# Patient Record
Sex: Female | Born: 2000 | Race: White | Hispanic: No | Marital: Married | Smoking: Never smoker
Health system: Southern US, Community
[De-identification: ages and names within clinical notes are randomized; demographics above are authoritative.]

## PROBLEM LIST (undated history)

## (undated) DIAGNOSIS — K219 Gastro-esophageal reflux disease without esophagitis: Secondary | ICD-10-CM

## (undated) DIAGNOSIS — F32A Depression, unspecified: Secondary | ICD-10-CM

## (undated) HISTORY — PX: NO PAST SURGERIES: SHX2092

## (undated) HISTORY — DX: Gastro-esophageal reflux disease without esophagitis: K21.9

## (undated) HISTORY — DX: Depression, unspecified: F32.A

---
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
## 2019-03-03 ENCOUNTER — Other Ambulatory Visit (HOSPITAL_COMMUNITY): Payer: Self-pay | Admitting: Family Medicine

## 2019-03-03 LAB — WET PREP FOR TRICH, YEAST, CLUE
Trichomonas Exam: NEGATIVE
Yeast Exam: NEGATIVE

## 2019-03-03 LAB — HM HIV SCREENING LAB: HM HIV Screening: NEGATIVE

## 2019-03-03 LAB — PREGNANCY, URINE: Preg Test, Ur: NEGATIVE

## 2019-05-09 ENCOUNTER — Encounter: Payer: Self-pay | Admitting: Emergency Medicine

## 2019-05-09 ENCOUNTER — Other Ambulatory Visit: Payer: Self-pay

## 2019-05-09 ENCOUNTER — Inpatient Hospital Stay
Admission: AD | Admit: 2019-05-09 | Discharge: 2019-05-12 | DRG: 881 | Disposition: A | Discharge status: Home / Self Care | Payer: Medicaid Other | Source: Intra-hospital | Attending: Psychiatry | Admitting: Psychiatry

## 2019-05-09 ENCOUNTER — Emergency Department: Payer: Medicaid Other

## 2019-05-09 ENCOUNTER — Emergency Department
Admission: EM | Admit: 2019-05-09 | Discharge: 2019-05-09 | Disposition: A | Discharge status: Other Acute Inpatient Hospital | Payer: Medicaid Other | Attending: Emergency Medicine | Admitting: Emergency Medicine

## 2019-05-09 DIAGNOSIS — T50992A Poisoning by other drugs, medicaments and biological substances, intentional self-harm, initial encounter: Secondary | ICD-10-CM | POA: Diagnosis not present

## 2019-05-09 DIAGNOSIS — F329 Major depressive disorder, single episode, unspecified: Principal | ICD-10-CM | POA: Diagnosis present

## 2019-05-09 DIAGNOSIS — G8929 Other chronic pain: Secondary | ICD-10-CM | POA: Diagnosis not present

## 2019-05-09 DIAGNOSIS — Z20828 Contact with and (suspected) exposure to other viral communicable diseases: Secondary | ICD-10-CM | POA: Diagnosis not present

## 2019-05-09 DIAGNOSIS — Z915 Personal history of self-harm: Secondary | ICD-10-CM

## 2019-05-09 DIAGNOSIS — R109 Unspecified abdominal pain: Secondary | ICD-10-CM | POA: Diagnosis present

## 2019-05-09 DIAGNOSIS — R1084 Generalized abdominal pain: Secondary | ICD-10-CM

## 2019-05-09 DIAGNOSIS — Z9151 Personal history of suicidal behavior: Secondary | ICD-10-CM | POA: Diagnosis present

## 2019-05-09 DIAGNOSIS — T50902A Poisoning by unspecified drugs, medicaments and biological substances, intentional self-harm, initial encounter: Secondary | ICD-10-CM

## 2019-05-09 DIAGNOSIS — K219 Gastro-esophageal reflux disease without esophagitis: Secondary | ICD-10-CM | POA: Diagnosis present

## 2019-05-09 DIAGNOSIS — F322 Major depressive disorder, single episode, severe without psychotic features: Secondary | ICD-10-CM | POA: Diagnosis present

## 2019-05-09 DIAGNOSIS — Z79899 Other long term (current) drug therapy: Secondary | ICD-10-CM | POA: Diagnosis not present

## 2019-05-09 DIAGNOSIS — R41843 Psychomotor deficit: Secondary | ICD-10-CM | POA: Diagnosis present

## 2019-05-09 DIAGNOSIS — R112 Nausea with vomiting, unspecified: Secondary | ICD-10-CM | POA: Diagnosis not present

## 2019-05-09 DIAGNOSIS — F332 Major depressive disorder, recurrent severe without psychotic features: Secondary | ICD-10-CM

## 2019-05-09 HISTORY — DX: Poisoning by unspecified drugs, medicaments and biological substances, intentional self-harm, initial encounter: T50.902A

## 2019-05-09 LAB — COMPREHENSIVE METABOLIC PANEL
ALT: 13 U/L (ref 0–44)
AST: 16 U/L (ref 15–41)
Albumin: 4.3 g/dL (ref 3.5–5.0)
Alkaline Phosphatase: 38 U/L (ref 38–126)
Anion gap: 8 (ref 5–15)
BUN: 14 mg/dL (ref 6–20)
CO2: 26 mmol/L (ref 22–32)
Calcium: 9.3 mg/dL (ref 8.9–10.3)
Chloride: 104 mmol/L (ref 98–111)
Creatinine, Ser: 0.6 mg/dL (ref 0.44–1.00)
GFR calc Af Amer: >60 mL/min (ref >60)
GFR calc non Af Amer: >60 mL/min (ref >60)
Glucose, Bld: 99 mg/dL (ref 70–99)
Potassium: 3.8 mmol/L (ref 3.5–5.1)
Sodium: 138 mmol/L (ref 135–145)
Total Bilirubin: 0.9 mg/dL (ref 0.3–1.2)
Total Protein: 7.5 g/dL (ref 6.5–8.1)

## 2019-05-09 LAB — URINALYSIS, COMPLETE (UACMP) WITH MICROSCOPIC
Bacteria, UA: NONE SEEN
Bilirubin Urine: NEGATIVE
Glucose, UA: NEGATIVE mg/dL
Hgb urine dipstick: NEGATIVE
Ketones, ur: 5 mg/dL — AB
Leukocytes,Ua: NEGATIVE
Nitrite: NEGATIVE
Protein, ur: NEGATIVE mg/dL
Specific Gravity, Urine: 1.012 (ref 1.005–1.030)
pH: 6 (ref 5.0–8.0)

## 2019-05-09 LAB — CBC
HCT: 34.9 % — ABNORMAL LOW (ref 36.0–46.0)
Hemoglobin: 11.9 g/dL — ABNORMAL LOW (ref 12.0–15.0)
MCH: 30.1 pg (ref 26.0–34.0)
MCHC: 34.1 g/dL (ref 30.0–36.0)
MCV: 88.1 fL (ref 80.0–100.0)
Platelets: 183 10*3/uL (ref 150–400)
RBC: 3.96 MIL/uL (ref 3.87–5.11)
RDW: 12 % (ref 11.5–15.5)
WBC: 6.6 10*3/uL (ref 4.0–10.5)
nRBC: 0 % (ref 0.0–0.2)

## 2019-05-09 LAB — URINE DRUG SCREEN, QUALITATIVE (ARMC ONLY)
Amphetamines, Ur Screen: NOT DETECTED
Barbiturates, Ur Screen: NOT DETECTED
Benzodiazepine, Ur Scrn: NOT DETECTED
Cannabinoid 50 Ng, Ur ~~LOC~~: POSITIVE — AB
Cocaine Metabolite,Ur ~~LOC~~: NOT DETECTED
MDMA (Ecstasy)Ur Screen: NOT DETECTED
Methadone Scn, Ur: NOT DETECTED
Opiate, Ur Screen: NOT DETECTED
Phencyclidine (PCP) Ur S: NOT DETECTED
Tricyclic, Ur Screen: NOT DETECTED

## 2019-05-09 LAB — ACETAMINOPHEN LEVEL: Acetaminophen (Tylenol), Serum: <10 ug/mL — ABNORMAL LOW (ref 10–30)

## 2019-05-09 LAB — SARS CORONAVIRUS 2 BY RT PCR (HOSPITAL ORDER, PERFORMED IN ~~LOC~~ HOSPITAL LAB): SARS Coronavirus 2: NEGATIVE

## 2019-05-09 LAB — MAGNESIUM: Magnesium: 2.2 mg/dL (ref 1.7–2.4)

## 2019-05-09 LAB — ETHANOL: Alcohol, Ethyl (B): <10 mg/dL (ref <10)

## 2019-05-09 LAB — POCT PREGNANCY, URINE: Preg Test, Ur: NEGATIVE

## 2019-05-09 LAB — LIPASE, BLOOD: Lipase: 26 U/L (ref 11–51)

## 2019-05-09 LAB — SALICYLATE LEVEL: Salicylate Lvl: <7 mg/dL (ref 2.8–30.0)

## 2019-05-09 MED ORDER — SUCRALFATE 1 G PO TABS: 1.0000 g | ORAL_TABLET | Freq: Four times a day (QID) | ORAL | 1 refills | Status: DC | PRN

## 2019-05-09 MED ORDER — DIPHENHYDRAMINE HCL 25 MG PO CAPS
50.0000 mg | ORAL_CAPSULE | Freq: Three times a day (TID) | ORAL | Status: DC | PRN
  Administered 2019-05-11: 50 mg via ORAL
  Filled 2019-05-09: qty 2

## 2019-05-09 MED ORDER — ALUM & MAG HYDROXIDE-SIMETH 200-200-20 MG/5ML PO SUSP: 30.0000 mL | ORAL | Status: DC | PRN

## 2019-05-09 MED ORDER — NORGESTIM-ETH ESTRAD TRIPHASIC 0.18/0.215/0.25 MG-35 MCG PO TABS
1.0000 | ORAL_TABLET | Freq: Every day | ORAL | Status: DC
  Administered 2019-05-10 – 2019-05-12 (×3): 1 via ORAL
  Filled 2019-05-09 (×4): qty 1

## 2019-05-09 MED ORDER — ONDANSETRON 4 MG PO TBDP: ORAL_TABLET | ORAL | 0 refills | Status: DC

## 2019-05-09 MED ORDER — NORGESTIM-ETH ESTRAD TRIPHASIC 0.18/0.215/0.25 MG-35 MCG PO TABS
1.0000 | ORAL_TABLET | Freq: Every day | ORAL | Status: DC
  Administered 2019-05-09: 1 via ORAL

## 2019-05-09 MED ORDER — SUCRALFATE 1 G PO TABS: 1.0000 g | ORAL_TABLET | Freq: Four times a day (QID) | ORAL | Status: DC | PRN

## 2019-05-09 MED ORDER — OMEPRAZOLE MAGNESIUM 20 MG PO TBEC: 20.0000 mg | DELAYED_RELEASE_TABLET | Freq: Every day | ORAL | Status: DC

## 2019-05-09 MED ORDER — ACETAMINOPHEN 325 MG PO TABS: 650.0000 mg | ORAL_TABLET | Freq: Four times a day (QID) | ORAL | Status: DC | PRN

## 2019-05-09 MED ORDER — OMEPRAZOLE MAGNESIUM 20 MG PO TBEC: 20.0000 mg | DELAYED_RELEASE_TABLET | Freq: Every day | ORAL | 1 refills | Status: DC

## 2019-05-09 MED ORDER — MAGNESIUM HYDROXIDE 400 MG/5ML PO SUSP
30.0000 mL | Freq: Every day | ORAL | Status: DC | PRN
  Administered 2019-05-11: 30 mL via ORAL
  Filled 2019-05-09: qty 30

## 2019-05-09 MED ORDER — ALUM & MAG HYDROXIDE-SIMETH 200-200-20 MG/5ML PO SUSP
30.0000 mL | ORAL | Status: DC | PRN
  Administered 2019-05-09: 30 mL via ORAL
  Filled 2019-05-09: qty 30

## 2019-05-09 MED ORDER — ONDANSETRON HCL 4 MG PO TABS
8.0000 mg | ORAL_TABLET | Freq: Three times a day (TID) | ORAL | Status: DC | PRN
  Administered 2019-05-09: 8 mg via ORAL
  Filled 2019-05-09: qty 2

## 2019-05-09 MED ORDER — SUCRALFATE 1 G PO TABS
1.0000 g | ORAL_TABLET | Freq: Once | ORAL | Status: AC
  Administered 2019-05-09: 1 g via ORAL
  Filled 2019-05-09: qty 1

## 2019-05-09 MED ORDER — HYDROXYZINE HCL 10 MG PO TABS
10.0000 mg | ORAL_TABLET | Freq: Three times a day (TID) | ORAL | Status: DC | PRN
  Administered 2019-05-10 – 2019-05-11 (×3): 10 mg via ORAL
  Filled 2019-05-09 (×4): qty 1

## 2019-05-09 MED ORDER — DICYCLOMINE HCL 10 MG PO CAPS
10.0000 mg | ORAL_CAPSULE | Freq: Once | ORAL | Status: AC
  Administered 2019-05-09: 10 mg via ORAL
  Filled 2019-05-09: qty 1

## 2019-05-09 MED ORDER — PANTOPRAZOLE SODIUM 40 MG PO TBEC
40.0000 mg | DELAYED_RELEASE_TABLET | Freq: Every day | ORAL | Status: DC
  Administered 2019-05-09 – 2019-05-12 (×4): 40 mg via ORAL
  Filled 2019-05-09 (×4): qty 1

## 2019-05-09 MED ORDER — ONDANSETRON HCL 4 MG PO TABS: 8.0000 mg | ORAL_TABLET | Freq: Three times a day (TID) | ORAL | Status: DC | PRN

## 2019-05-09 NOTE — Progress Notes (Signed)
18 year old female admitted to unit. Alert and oriented x4. Currently denies SI/HI/AVH at present. Patient verbally contracts for safety. Patient reports that current stressors are the her current living arrangements with her partner and partner parents, her job and the turmoil in the relationship with her partner. Patient with sad affect, c/o abdominal pain rating it a 4. Oriented patient to room and unit. Skin and contraband search completed and witnessed by Waneta Martins, RN.  Tattoo to R) FA and ecchymosis to R) thigh , skin otherwise warm, dry and intact. No contraband found on patient nor her belongings. Home medications sent to pharmacy for storage and dispensing. Admission assessment completed, fluid and nutrition offered. Patient remains safe on the unit with q 15 minute checks.

## 2019-05-09 NOTE — ED Notes (Signed)
Report received from Jordan RN

## 2019-05-09 NOTE — Plan of Care (Signed)
New admission.  Problem: Education: Goal: Knowledge of [facility] Education information/materials will improve Outcome: Not Progressing Goal: Emotional status will improve Outcome: Not Progressing Goal: Mental status will improve Outcome: Not Progressing Goal: Verbalization of understanding the information provided will improve Outcome: Not Progressing   Problem: Activity: Goal: Interest or engagement in activities will improve Outcome: Not Progressing Goal: Sleeping patterns will improve Outcome: Not Progressing   Problem: Safety: Goal: Periods of time without injury will increase Outcome: Not Progressing   Problem: Coping: Goal: Coping ability will improve Outcome: Not Progressing Goal: Will verbalize feelings Outcome: Not Progressing   Problem: Health Behavior/Discharge Planning: Goal: Ability to make decisions will improve Outcome: Not Progressing Goal: Compliance with therapeutic regimen will improve Outcome: Not Progressing   Problem: Self-Concept: Goal: Will verbalize positive feelings about self Outcome: Not Progressing Goal: Level of anxiety will decrease Outcome: Not Progressing   

## 2019-05-09 NOTE — ED Provider Notes (Signed)
[facility] Emergency Department Provider Note  ____________________________________________   First MD Initiated Contact with Patient 05/09/19 0319     (approximate)  I have reviewed the triage vital signs and the nursing notes.   HISTORY  Chief Complaint Abdominal Pain and Suicidal    HPI Ashley Byrd is a 18 y.o. female who denies chronic medical issues and presents for evaluation of 2 main problems, abdominal pain with nausea and vomiting, and depression with some passive suicidal ideation.  Abdominal pain: The patient reports that she has been having "stomach problems" on and off for about a year.  She says it is gotten worse over the last 2 weeks.  She says that every time she eats she feels nauseated and frequently vomits and she has sharp and aching pain in the top part of her abdomen.  She went to an urgent care and they gave her medicine for acid reflux but it did not seem to help.  She vomits at least once every day.  Eating makes it worse and nothing particular makes it better.  Depression: She reports that she is suffered from depression for years but it seems to have gotten worse recently after moving out of her mom's house and into the house of parents of a friend of hers.  Her depression is become severe and it was bad enough about 2 weeks ago that she took all of her medicines and attempt to overdose, but she had no bad side effects.  She states that she still thinks about killing herself but she has no plans to do so and she does not want to die, she wants to get help.  She takes no antidepressants.  She has never been hospitalized for psychiatric purposes.  She is willing to contract for safety and knows that she needs help.    She says that her symptoms are severe and gradual in onset (both of them).  She denies fever/chills, shortness of breath, cough, lower abdominal pain, dysuria.  Her last menstrual cycle was 2 weeks ago.     History reviewed. No pertinent past medical history.  There are no active problems to display for this patient.   History reviewed. No pertinent surgical history.  Prior to Admission medications   Medication Sig Start Date End Date Taking? Authorizing Provider  omeprazole (PRILOSEC OTC) 20 MG tablet Take 1 tablet (20 mg total) by mouth daily. 05/09/19 05/08/20  Loleta RoseForbach, Schylar Wuebker, MD  ondansetron (ZOFRAN ODT) 4 MG disintegrating tablet Allow 1-2 tablets to dissolve in your mouth every 8 hours as needed for nausea/vomiting 05/09/19   Loleta RoseForbach, Margaretann Abate, MD  sucralfate (CARAFATE) 1 g tablet Take 1 tablet (1 g total) by mouth 4 (four) times daily as needed (for abdominal discomfort, nausea, and/or vomiting). 05/09/19   Loleta RoseForbach, Daisey Caloca, MD    Allergies Patient has no known allergies.  No family history on file.  Social History Social History   Tobacco Use  . Smoking status: Never Smoker  . Smokeless tobacco: Never Used  Substance Use Topics  . Alcohol use: Not Currently  . Drug use: Never    Review of Systems Constitutional: No fever/chills Eyes: No visual changes. ENT: No sore throat. Cardiovascular: Denies chest pain. Respiratory: Denies shortness of breath. Gastrointestinal: Upper abdominal pain with nausea and vomiting, occurs whenever she eats, has been going on about a year. Genitourinary: Negative for dysuria. Musculoskeletal: Negative for neck pain.  Negative for back pain. Integumentary: Negative for rash. Neurological: Negative for  headaches, focal weakness or numbness. Psych: Depression with alleged suicide attempt (overdose) about 2 weeks ago, now just depressed and knows she needs help  ____________________________________________   PHYSICAL EXAM:  VITAL SIGNS: ED Triage Vitals  Enc Vitals Group     BP 05/09/19 0259 128/86     Pulse Rate 05/09/19 0259 82     Resp 05/09/19 0259 18     Temp 05/09/19 0259 97.9 F (36.6 C)     Temp Source 05/09/19 0259 Oral     SpO2  05/09/19 0259 97 %     Weight 05/09/19 0255 48.5 kg (107 lb)     Height 05/09/19 0255 1.524 m (5')     Head Circumference --      Peak Flow --      Pain Score 05/09/19 0255 8     Pain Loc --      Pain Edu? --      Excl. in [place]? --    Constitutional: Alert and oriented. Well appearing and in no acute distress. Eyes: Conjunctivae are normal.  Head: Atraumatic. Nose: No congestion/rhinnorhea. Mouth/Throat: Mucous membranes are moist. Neck: No stridor.  No meningeal signs.   Cardiovascular: Normal rate, regular rhythm. Good peripheral circulation. Grossly normal heart sounds. Respiratory: Normal respiratory effort.  No retractions. No audible wheezing. Gastrointestinal: Soft and nondistended.  Mild tenderness to palpation of the epigastrium and supraumbilical region.  Equivocal Murphy sign.  No lower abdominal tenderness to palpation, no rebound, no guarding, no evidence of peritonitis. Musculoskeletal: No lower extremity tenderness nor edema. No gross deformities of extremities. Neurologic:  Normal speech and language. No gross focal neurologic deficits are appreciated.  Skin:  Skin is warm, dry and intact. No rash noted. Psychiatric: Mood and affect are normal. Speech and behavior are normal.  She has a capacity to make her own decisions.  She admits to depression but contracts for safety, some persistent passive SI but no plan and she states that she does not want to die.  ____________________________________________   LABS (all labs ordered are listed, but only abnormal results are displayed)  Labs Reviewed  ACETAMINOPHEN LEVEL - Abnormal; Notable for the following components:      Result Value   Acetaminophen (Tylenol), Serum <10 (*)    All other components within normal limits  CBC - Abnormal; Notable for the following components:   Hemoglobin 11.9 (*)    HCT 34.9 (*)    All other components within normal limits  URINALYSIS, COMPLETE (UACMP) WITH MICROSCOPIC - Abnormal; Notable  for the following components:   Color, Urine YELLOW (*)    APPearance HAZY (*)    Ketones, ur 5 (*)    All other components within normal limits  URINE DRUG SCREEN, QUALITATIVE ([facility] ONLY) - Abnormal; Notable for the following components:   Cannabinoid 50 Ng, Ur [place] POSITIVE (*)    All other components within normal limits  SARS CORONAVIRUS 2 (HOSPITAL ORDER, [place] [facility])  COMPREHENSIVE METABOLIC PANEL  ETHANOL  SALICYLATE LEVEL  LIPASE, BLOOD  MAGNESIUM  POC URINE PREG, ED  POCT PREGNANCY, URINE   ____________________________________________  EKG  No indication for EKG ____________________________________________  RADIOLOGY   ED MD interpretation: No acute abnormalities identified on right upper quadrant ultrasound  Official radiology report(s): US Abdomen Limited Ruq  Result Date: 05/09/2019 CLINICAL DATA:  Abdominal pain and vomiting. EXAM: ULTRASOUND ABDOMEN LIMITED RIGHT UPPER QUADRANT COMPARISON:  None. FINDINGS: Gallbladder: No gallstones or wall thickening visualized. No sonographic Percell Miller  sign noted by sonographer. Maximal wall thickness is 2.2 mm, within normal limits Common bile duct: Diameter: 3.8 mm, within normal limits Liver: No focal lesion identified. Within normal limits in parenchymal echogenicity. Portal vein is patent on color Doppler imaging with normal direction of blood flow towards the liver. IMPRESSION: Normal right upper quadrant ultrasound. No acute or focal lesion to explain the patient's abdominal pain or vomiting. Electronically Signed   By: Marin Robertshristopher  Mattern M.D.   On: 05/09/2019 05:07    ____________________________________________   PROCEDURES   Procedure(s) performed (including Critical Care):  Procedures   ____________________________________________   INITIAL IMPRESSION / MDM / ASSESSMENT AND PLAN / ED COURSE  As part of my medical decision making, I reviewed the following data within the electronic  MEDICAL RECORD NUMBER Nursing notes reviewed and incorporated, Labs reviewed , Old chart reviewed, A consult was requested and obtained from this/these consultant(s) Psychiatry, Notes from prior ED visits and [place] Controlled Substance Database      *Ashley Byrd was evaluated in Emergency Department on 05/09/2019 for the symptoms described in the history of present illness. She was evaluated in the context of the global COVID-19 pandemic, which necessitated consideration that the patient might be at risk for infection with the SARS-CoV-2 virus that causes COVID-19. Institutional protocols and algorithms that pertain to the evaluation of patients at risk for COVID-19 are in a state of rapid change based on information released by regulatory bodies including the CDC and federal and state organizations. These policies and algorithms were followed during the patient's care in the ED.  Some ED evaluations and interventions may be delayed as a result of limited staffing during the pandemic.*  Differential diagnosis for the abdominal pain includes, but is not limited to, biliary colic, acid reflux, persistent nonspecific gastritis, IBD, IBS.  Differential diagnosis for depression includes true suicidal ideation, major depressive disorder, mood disorder, adjustment disorder.  She is not in any acute distress and her vital signs are stable.  Lab work is all reassuring with normal comprehensive metabolic panel, lipase, ethanol, magnesium, salicylate, acetaminophen, and urine tests.  Based on the length of time she has been having the abdominal pain, nausea, and vomiting, I suspect that she suffers from biliary colic but also her psychiatric health may also be contributing to her abdominal discomfort (IBS instead of IBD).  She has no peritoneal signs, lab work is all reassuring, and I do not think she would benefit from a CT scan.  However I will check an ultrasound of her right upper quadrant to look for evidence of  gallstones.  After the ultrasound I will try some Bentyl as well as some Carafate.  I anticipate she will benefit from GI follow-up.  Regarding the psychiatric disposition, the patient wants help and contracts for safety.  I do not believe she represents an immediate risk to herself or anyone else.  I will not put her under involuntary commitment but have asked psychiatry to see her.  She agrees with the plan.  Clinical Course as of May 08 622  Mon May 09, 2019  0319 Preg Test, Ur: NEGATIVE [CF]  0510 Reassuring ultrasound with no evidence of gallstones.  I suspect the patient is suffering from some gastritis and would benefit from gastroenterology evaluation but does not require further imaging at this time.  I have ordered Bentyl 10 mg by mouth and Carafate 1 g by mouth.  She is awaiting psychiatric evaluation and disposition.  US ABDOMEN LIMITED RUQ [CF]  [phone] Labs are all within normal limits including urinalysis.  She does have a few ketones in her urine but not a significant amount.   [CF]  0620 SARS Coronavirus 2: NEGATIVE [CF]    Clinical Course User Index [CF] Loleta RoseForbach, Mariaguadalupe Fialkowski, MD     ____________________________________________  FINAL CLINICAL IMPRESSION(S) / ED DIAGNOSES  Final diagnoses:  Severe episode of recurrent major depressive disorder, without psychotic features (HCC)  Chronic abdominal pain     MEDICATIONS GIVEN DURING THIS VISIT:  Medications  dicyclomine (BENTYL) capsule 10 mg (10 mg Oral Given 05/09/19 0459)  sucralfate (CARAFATE) tablet 1 g (1 g Oral Given 05/09/19 0459)     ED Discharge Orders         Ordered    sucralfate (CARAFATE) 1 g tablet  4 times daily PRN     05/09/19 0623    omeprazole (PRILOSEC OTC) 20 MG tablet  Daily     05/09/19 0623    ondansetron (ZOFRAN ODT) 4 MG disintegrating tablet     05/09/19 [id number]           Note:  This document was prepared using Dragon voice recognition software and may include unintentional dictation errors.    Loleta RoseForbach, Shermaine Brigham, MD 05/09/19 [phone]
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
## 2019-05-09 NOTE — ED Notes (Signed)
TTS at the bedside. 

## 2019-05-09 NOTE — ED Notes (Signed)
ED Provider at bedside. 

## 2019-05-09 NOTE — ED Triage Notes (Signed)
Pt arrives POV to triage with c/o stomach problems x 1 year that has become worse in the last two weeks. Pt states that every time that she eats she has been vomiting. Pt states that she has had x 2 episodes of eating and then vomiting today. Pt appears well nourished and hydrated at this time and is in NAD.

## 2019-05-09 NOTE — ED Notes (Signed)
Patient states that she has had suicidal thoughts for years. Patient states that over the last few weeks that the thoughts have been increasing. Patient states that about 2 weeks ago she overdosed on medication but not receive any medical attention at that time. Patient is contracting for safety at this time.

## 2019-05-09 NOTE — BH Assessment (Signed)
Patient is to be admitted to [facility] by Dr. Leverne Humbles.  Attending Physician will be Dr. Weber Cooks.   Patient has been assigned to room 323, by [facility] Charge Nurse T'Yawn.   Intake Paper Work has been signed and placed on patient chart.  ER staff is aware of the admission:  Lattie Haw, ER Secretary    Dr. Cherylann Banas, ER MD   Quillian Quince, Patient's Nurse   Gust Rung., Patient Access

## 2019-05-09 NOTE — Tx Team (Signed)
Initial Treatment Plan 05/09/2019 5:22 PM Ashley Byrd RCB:[id number]    PATIENT STRESSORS: Health problems Marital or family conflict Occupational concerns   PATIENT STRENGTHS: Ability for insight General fund of knowledge Motivation for treatment/growth   PATIENT IDENTIFIED PROBLEMS: Depression  Health concerns                   DISCHARGE CRITERIA:  Ability to meet basic life and health needs Improved stabilization in mood, thinking, and/or behavior Motivation to continue treatment in a less acute level of care Reduction of life-threatening or endangering symptoms to within safe limits  PRELIMINARY DISCHARGE PLAN: Outpatient therapy Return to previous living arrangement Return to previous work or school arrangements  PATIENT/FAMILY INVOLVEMENT: This treatment plan has been presented to and reviewed with the patient, Ashley Byrd. The patient has been given the opportunity to ask questions and make suggestions.  Manmeet Arzola, RN 05/09/2019, 5:22 PM
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
## 2019-05-10 DIAGNOSIS — F322 Major depressive disorder, single episode, severe without psychotic features: Secondary | ICD-10-CM

## 2019-05-10 MED ORDER — FLUOXETINE HCL 10 MG PO CAPS
10.0000 mg | ORAL_CAPSULE | Freq: Every day | ORAL | Status: DC
  Administered 2019-05-10 – 2019-05-12 (×3): 10 mg via ORAL
  Filled 2019-05-10 (×3): qty 1

## 2019-05-10 MED ORDER — ONDANSETRON HCL 4 MG PO TABS: 4.0000 mg | ORAL_TABLET | Freq: Three times a day (TID) | ORAL | Status: DC | PRN

## 2019-05-10 MED ORDER — ENSURE ENLIVE PO LIQD
237.0000 mL | Freq: Two times a day (BID) | ORAL | Status: DC
  Administered 2019-05-11 (×2): 237 mL via ORAL

## 2019-05-10 NOTE — H&P (Signed)
Psychiatric Admission Assessment Adult  Patient Identification: Ashley Byrd MRN:  [id number] Date of Evaluation:  05/10/2019 Chief Complaint:  Depression Principal Diagnosis: MDD (major depressive disorder), single episode, severe (HCC) Diagnosis:  Principal Problem:   MDD (major depressive disorder), single episode, severe (HCC) Active Problems:   Suicide attempt by drug overdose (HCC)   Abdominal pain  History of Present Illness: Patient seen and chart reviewed.  This is an 18 year old woman who presented to the emergency room initially to complain about her stomach pain but then admitted she was depressed and having suicidal ideation.  Patient says that she has been feeling depressed on and off for a couple years but things of rapidly escalated over the last couple months.  Moods feels down a lot.  Feels negative and hopeless.  Feels like she will never get anywhere in life.  She says that the people she lives with are stressing her out and making her feel even worse about herself although when she describes it it sounds like it is more that they are just encouraging her and not being abusive.  Patient admits to recent suicidal ideation and says a couple weeks ago she took pills for an overdose but did not get any treatment for it.  She has not been getting any outpatient psychiatric treatment.  Patient reports that there has been times in the not too distant past when she briefly heard hallucinations but she is not hearing any kind of hallucinations or having any psychotic symptoms now.  Has used marijuana once or twice in the last month but claims to not use it regularly and not to drink regularly.  Patient is complaining of chronic severe stomach pain.  The way she describes it sounds typical for reflux indicating that it is in her area around her mid epigastric region with radiation straight up along her esophagus.  Also has recently been throwing up and not eating well.  [facility] but no other medical treatment for it so far. Associated Signs/Symptoms: Depression Symptoms:  depressed mood, anhedonia, psychomotor retardation, feelings of worthlessness/guilt, difficulty concentrating, suicidal thoughts without plan, (Hypo) Manic Symptoms:  None reported Anxiety Symptoms:  Excessive Worry, Psychotic Symptoms:  Hallucinations: Auditory PTSD Symptoms: Had a traumatic exposure:  Patient describes a situation in which she had inappropriate photographs of herself distributor around her high school a couple years ago.  This was so humiliating it caused her to have to leave her high school and leave her mother's home and relocate to another school district. Total Time spent with patient: 1 hour  Past Psychiatric History: Reports that a couple weeks ago she did overdose on pills.  Did not apparently do her self any harm.  She denies any history of bulimia even though she is throwing up so she never makes herself throw up on purpose.  Never been on any antidepressants or had any psychiatric treatment in the past no previous hospitalizations  Is the patient at risk to self? Yes.    Has the patient been a risk to self in the past 6 months? Yes.    Has the patient been a risk to self within the distant past? No.  Is the patient a risk to others? No.  Has the patient been a risk to others in the past 6 months? No.  Has the patient been a risk to others within the distant past? No.   Prior Inpatient Therapy:   Prior Outpatient Therapy:  Alcohol Screening: 1. How often do you have a drink containing alcohol?: Never 2. How many drinks containing alcohol do you have on a typical day when you are drinking?: 1 or 2 3. How often do you have six or more drinks on one occasion?: Never AUDIT-C Score: 0 4. How often during the last year have you found that you were not able to stop drinking once you had started?: Never 5. How often during the last year have you  failed to do what was normally expected from you becasue of drinking?: Never 6. How often during the last year have you needed a first drink in the morning to get yourself going after a heavy drinking session?: Never 7. How often during the last year have you had a feeling of guilt of remorse after drinking?: Never 8. How often during the last year have you been unable to remember what happened the night before because you had been drinking?: Never 9. Have you or someone else been injured as a result of your drinking?: No 10. Has a relative or friend or a doctor or another health worker been concerned about your drinking or suggested you cut down?: No Alcohol Use Disorder Identification Test Final Score (AUDIT): 0 Alcohol Brief Interventions/Follow-up: AUDIT Score <7 follow-up not indicated Substance Abuse History in the last 12 months:  No. Consequences of Substance Abuse: Negative Previous Psychotropic Medications: No  Psychological Evaluations: No  Past Medical History: History reviewed. No pertinent past medical history. History reviewed. No pertinent surgical history. Family History: History reviewed. No pertinent family history. Family Psychiatric  History: She says that her mother has told her that hearing voices runs in the family but she does not know what that means and does not know of any specific history of mental health problems in the family.  Knows of no suicide in the family. Tobacco Screening: Have you used any form of tobacco in the last 30 days? (Cigarettes, Smokeless Tobacco, Cigars, and/or Pipes): No Social History:  Social History   Substance and Sexual Activity  Alcohol Use Not Currently     Social History   Substance and Sexual Activity  Drug Use Never    Additional Social History: Marital status: Single Are you sexually active?: Yes What is your sexual orientation?: Heterosexual Has your sexual activity been affected by drugs, alcohol, medication, or  emotional stress?: Pt denies. Does patient have children?: No    History of alcohol / drug use?: No history of alcohol / drug abuse                    Allergies:  No Known Allergies Lab Results:  Results for orders placed or performed during the hospital encounter of 05/09/19 (from the past 48 hour(s))  Comprehensive metabolic panel     Status: None   Collection Time: 05/09/19  3:04 AM  Result Value Ref Range   Sodium 138 135 - 145 mmol/L   Potassium 3.8 3.5 - 5.1 mmol/L   Chloride 104 98 - 111 mmol/L   CO2 26 22 - 32 mmol/L   Glucose, Bld 99 70 - 99 mg/dL   BUN 14 6 - 20 mg/dL   Creatinine, Ser 1.61 0.44 - 1.00 mg/dL   Calcium 9.3 8.9 - 09.6 mg/dL   Total Protein 7.5 6.5 - 8.1 g/dL   Albumin 4.3 3.5 - 5.0 g/dL   AST 16 15 - 41 U/L   ALT 13 0 - 44 U/L   Alkaline Phosphatase  38 38 - 126 U/L   Total Bilirubin 0.9 0.3 - 1.2 mg/dL   GFR calc non Af Amer >60 >60 mL/min   GFR calc Af Amer >60 >60 mL/min   Anion gap 8 5 - 15    Comment: Performed at [facility], [address] [place], [place] [address]  Ethanol     Status: None   Collection Time: 05/09/19  3:04 AM  Result Value Ref Range   Alcohol, Ethyl (B) <10 <10 mg/dL    Comment: (NOTE) Lowest detectable limit for serum alcohol is 10 mg/dL. For medical purposes only. Performed at [facility], [address] [place], [place] [address]   Salicylate level     Status: None   Collection Time: 05/09/19  3:04 AM  Result Value Ref Range   Salicylate Lvl <7.0 2.8 - 30.0 mg/dL    Comment: Performed at [facility], [address] [place], [place] [address]  Acetaminophen level     Status: Abnormal   Collection Time: 05/09/19  3:04 AM  Result Value Ref Range   Acetaminophen (Tylenol), Serum <10 (L) 10 - 30 ug/mL    Comment: (NOTE) Therapeutic concentrations vary significantly. A range of 10-30 ug/mL  may be an effective concentration for many patients. However, some  are best treated  at concentrations outside of this range. Acetaminophen concentrations >150 ug/mL at 4 hours after ingestion  and >50 ug/mL at 12 hours after ingestion are often associated with  toxic reactions. Performed at [facility], [address] [place], [place] [address]   cbc     Status: Abnormal   Collection Time: 05/09/19  3:04 AM  Result Value Ref Range   WBC 6.6 4.0 - 10.5 K/uL   RBC 3.96 3.87 - 5.11 MIL/uL   Hemoglobin 11.9 (L) 12.0 - 15.0 g/dL   HCT 29.534.9 (L) 62.136.0 - 30.846.0 %   MCV 88.1 80.0 - 100.0 fL   MCH 30.1 26.0 - 34.0 pg   MCHC 34.1 30.0 - 36.0 g/dL   RDW 65.712.0 84.611.5 - 96.215.5 %   Platelets 183 150 - 400 K/uL   nRBC 0.0 0.0 - 0.2 %    Comment: Performed at [facility], [address] [place], [place] [address]  Lipase, blood     Status: None   Collection Time: 05/09/19  3:04 AM  Result Value Ref Range   Lipase 26 11 - 51 U/L    Comment: Performed at [facility], [address] [place], [place] [address]  Urinalysis, Complete w Microscopic     Status: Abnormal   Collection Time: 05/09/19  3:04 AM  Result Value Ref Range   Color, Urine YELLOW (A) YELLOW   APPearance HAZY (A) CLEAR   Specific Gravity, Urine 1.012 1.005 - 1.030   pH 6.0 5.0 - 8.0   Glucose, UA NEGATIVE NEGATIVE mg/dL   Hgb urine dipstick NEGATIVE NEGATIVE   Bilirubin Urine NEGATIVE NEGATIVE   Ketones, ur 5 (A) NEGATIVE mg/dL   Protein, ur NEGATIVE NEGATIVE mg/dL   Nitrite NEGATIVE NEGATIVE   Leukocytes,Ua NEGATIVE NEGATIVE   WBC, UA 0-5 0 - 5 WBC/hpf   Bacteria, UA NONE SEEN NONE SEEN   Squamous Epithelial / LPF 0-5 0 - 5   Mucus PRESENT     Comment: Performed at [facility], [address] [place], [place] [address]  Magnesium     Status: None   Collection Time: 05/09/19  3:04 AM  Result  Value Ref Range   Magnesium 2.2 1.7 - 2.4 mg/dL    Comment: Performed at [facility], [address] [place], [place] [address]  Urine Drug Screen, Qualitative      Status: Abnormal   Collection Time: 05/09/19  3:04 AM  Result Value Ref Range   Tricyclic, Ur Screen NONE DETECTED NONE DETECTED   Amphetamines, Ur Screen NONE DETECTED NONE DETECTED   MDMA (Ecstasy)Ur Screen NONE DETECTED NONE DETECTED   Cocaine Metabolite,Ur [place] NONE DETECTED NONE DETECTED   Opiate, Ur Screen NONE DETECTED NONE DETECTED   Phencyclidine (PCP) Ur S NONE DETECTED NONE DETECTED   Cannabinoid 50 Ng, Ur [place] POSITIVE (A) NONE DETECTED   Barbiturates, Ur Screen NONE DETECTED NONE DETECTED   Benzodiazepine, Ur Scrn NONE DETECTED NONE DETECTED   Methadone Scn, Ur NONE DETECTED NONE DETECTED    Comment: (NOTE) Tricyclics + metabolites, urine    Cutoff 1000 ng/mL Amphetamines + metabolites, urine  Cutoff 1000 ng/mL MDMA (Ecstasy), urine              Cutoff 500 ng/mL Cocaine Metabolite, urine          Cutoff 300 ng/mL Opiate + metabolites, urine        Cutoff 300 ng/mL Phencyclidine (PCP), urine         Cutoff 25 ng/mL Cannabinoid, urine                 Cutoff 50 ng/mL Barbiturates + metabolites, urine  Cutoff 200 ng/mL Benzodiazepine, urine              Cutoff 200 ng/mL Methadone, urine                   Cutoff 300 ng/mL The urine drug screen provides only a preliminary, unconfirmed analytical test result and should not be used for non-medical purposes. Clinical consideration and professional judgment should be applied to any positive drug screen result due to possible interfering substances. A more specific alternate chemical method must be used in order to obtain a confirmed analytical result. Gas chromatography / mass spectrometry (GC/MS) is the preferred confirmat ory method. Performed at [facility], [address] [place], [place] [address]   Pregnancy, urine POC     Status: None   Collection Time: 05/09/19  3:07 AM  Result Value Ref Range   Preg Test, Ur NEGATIVE NEGATIVE    Comment:        THE SENSITIVITY OF THIS METHODOLOGY IS >24 mIU/mL   SARS  Coronavirus 2 (CEPHEID - Performed in [facility] hospital lab), Hosp Order     Status: None   Collection Time: 05/09/19  5:00 AM  Result Value Ref Range   SARS Coronavirus 2 NEGATIVE NEGATIVE    Comment: (NOTE) If result is NEGATIVE SARS-CoV-2 target nucleic acids are NOT DETECTED. The SARS-CoV-2 RNA is generally detectable in upper and lower  respiratory specimens during the acute phase of infection. The lowest  concentration of SARS-CoV-2 viral copies this assay can detect is 250  copies / mL. A negative result does not preclude SARS-CoV-2 infection  and should not be used as the sole basis for treatment or other  patient management decisions.  A negative result may occur with  improper specimen collection / handling, submission of specimen other  than nasopharyngeal swab, presence of viral mutation(s) within the  areas targeted by this assay, and inadequate number of viral copies  (<250 copies / mL). A negative result must be  combined with clinical  observations, patient history, and epidemiological information. If result is POSITIVE SARS-CoV-2 target nucleic acids are DETECTED. The SARS-CoV-2 RNA is generally detectable in upper and lower  respiratory specimens dur ing the acute phase of infection.  Positive  results are indicative of active infection with SARS-CoV-2.  Clinical  correlation with patient history and other diagnostic information is  necessary to determine patient infection status.  Positive results do  not rule out bacterial infection or co-infection with other viruses. If result is PRESUMPTIVE POSTIVE SARS-CoV-2 nucleic acids MAY BE PRESENT.   A presumptive positive result was obtained on the submitted specimen  and confirmed on repeat testing.  While 2019 novel coronavirus  (SARS-CoV-2) nucleic acids may be present in the submitted sample  additional confirmatory testing may be necessary for epidemiological  and / or clinical management purposes  to  differentiate between  SARS-CoV-2 and other Sarbecovirus currently known to infect humans.  If clinically indicated additional testing with an alternate test  methodology [phone]) is advised. The SARS-CoV-2 RNA is generally  detectable in upper and lower respiratory sp ecimens during the acute  phase of infection. The expected result is Negative. Fact Sheet for Patients:  BoilerBrush.com.cy Fact Sheet for Healthcare Providers: https://pope.com/ This test is not yet approved or cleared by the [place] FDA and has been authorized for detection and/or diagnosis of SARS-CoV-2 by FDA under an Emergency Use Authorization (EUA).  This EUA will remain in effect (meaning this test can be used) for the duration of the COVID-19 declaration under Section 564(b)(1) of the Act, 21 U.S.C. section 360bbb-3(b)(1), unless the authorization is terminated or revoked sooner. Performed at [facility], [address] [place], [place] [address]     Blood Alcohol level:  Lab Results  Component Value Date   [facility] <10 05/09/2019    Metabolic Disorder Labs:  No results found for: HGBA1C, MPG No results found for: PROLACTIN No results found for: CHOL, TRIG, HDL, CHOLHDL, VLDL, LDLCALC  Current Medications: Current Facility-Administered Medications  Medication Dose Route Frequency Provider Last Rate Last Dose  . acetaminophen (TYLENOL) tablet 650 mg  650 mg Oral Q6H PRN Mariel Craft, MD      . alum & mag hydroxide-simeth (MAALOX/MYLANTA) 200-200-20 MG/5ML suspension 30 mL  30 mL Oral Q4H PRN Mariel Craft, MD      . diphenhydrAMINE (BENADRYL) capsule 50 mg  50 mg Oral Q8H PRN Mariel Craft, MD      . feeding supplement (ENSURE ENLIVE) (ENSURE ENLIVE) liquid 237 mL  237 mL Oral BID BM Clapacs, John T, MD      . FLUoxetine (PROZAC) capsule 10 mg  10 mg Oral Daily Clapacs, Jackquline Denmark, MD   10 mg at 05/10/19 1249  . hydrOXYzine  (ATARAX/VISTARIL) tablet 10 mg  10 mg Oral TID PRN Mariel Craft, MD   10 mg at 05/10/19 0857  . magnesium hydroxide (MILK OF MAGNESIA) suspension 30 mL  30 mL Oral Daily PRN Mariel Craft, MD      . Norgestimate-Ethinyl Estradiol Triphasic 0.18/0.215/0.25 MG-35 MCG tablet 1 tablet  1 tablet Oral Daily Clapacs, Jackquline Denmark, MD   1 tablet at 05/10/19 1359  . ondansetron (ZOFRAN) tablet 4 mg  4 mg Oral Q8H PRN Clapacs, John T, MD      . pantoprazole (PROTONIX) EC tablet 40 mg  40 mg Oral Daily Clapacs, Jackquline Denmark, MD   40 mg at 05/10/19 0857   PTA Medications: Medications  Prior to Admission  Medication Sig Dispense Refill Last Dose  . omeprazole (PRILOSEC OTC) 20 MG tablet Take 1 tablet (20 mg total) by mouth daily. 28 tablet 1   . ondansetron (ZOFRAN ODT) 4 MG disintegrating tablet Allow 1-2 tablets to dissolve in your mouth every 8 hours as needed for nausea/vomiting 30 tablet 0   . sucralfate (CARAFATE) 1 g tablet Take 1 tablet (1 g total) by mouth 4 (four) times daily as needed (for abdominal discomfort, nausea, and/or vomiting). 30 tablet 1   . TRI-PREVIFEM 0.18/0.215/0.25 MG-35 MCG tablet Take 1 tablet by mouth daily. as directed   05/06/2019 at 2000    Musculoskeletal: Strength & Muscle Tone: within normal limits Gait & Station: normal Patient leans: N/A  Psychiatric Specialty Exam: Physical Exam  Nursing note and vitals reviewed. Constitutional: She appears well-developed and well-nourished.  HENT:  Head: Normocephalic and atraumatic.  Eyes: Pupils are equal, round, and reactive to light. Conjunctivae are normal.  Neck: Normal range of motion.  Cardiovascular: Regular rhythm and normal heart sounds.  Respiratory: Effort normal.  GI: Soft.  Musculoskeletal: Normal range of motion.  Neurological: She is alert.  Skin: Skin is warm and dry.  Psychiatric: Her mood appears anxious. Her speech is delayed. She is slowed. Cognition and memory are normal. She expresses impulsivity. She  exhibits a depressed mood. She expresses suicidal ideation. She expresses no suicidal plans.    Review of Systems  Constitutional: Negative.   HENT: Negative.   Eyes: Negative.   Respiratory: Negative.   Cardiovascular: Negative.   Gastrointestinal: Positive for heartburn, nausea and vomiting.  Musculoskeletal: Negative.   Skin: Negative.   Neurological: Negative.   Psychiatric/Behavioral: Positive for depression and suicidal ideas. Negative for hallucinations, memory loss and substance abuse. The patient is nervous/anxious. The patient does not have insomnia.     Blood pressure 115/68, pulse 62, temperature 98.5 F (36.9 C), temperature source Oral, resp. rate 18, height 5' (1.524 m), weight 48.1 kg, last menstrual period 04/25/2019, SpO2 100 %.Body mass index is 20.7 kg/m.  General Appearance: Casual  Eye Contact:  Fair  Speech:  Slow  Volume:  Decreased  Mood:  Depressed  Affect:  Depressed  Thought Process:  Coherent  Orientation:  Full (Time, Place, and Person)  Thought Content:  Logical  Suicidal Thoughts:  Yes.  without intent/plan  Homicidal Thoughts:  No  Memory:  Immediate;   Fair Recent;   Fair Remote;   Fair  Judgement:  Fair  Insight:  Fair  Psychomotor Activity:  Decreased  Concentration:  Concentration: Fair  Recall:  [employer] of Knowledge:  Fair  Language:  Fair  Akathisia:  No discharge  Handed:  Right  AIMS (if indicated):     Assets:  Desire for Improvement  ADL's:  Intact  Cognition:  WNL  Sleep:  Number of Hours: 8.15    Treatment Plan Summary: Daily contact with patient to assess and evaluate symptoms and progress in treatment, Medication management and Plan Psychoeducation regarding depression.  Start antidepressant medication.  Engage in individual and group therapy.  Reassess suicidality and reconsider discharge within the next couple days.  Observation Level/Precautions:  15 minute checks  Laboratory:  Chemistry Profile  Psychotherapy:     Medications:    Consultations:    Discharge Concerns:    Estimated LOS:  Other:     Physician Treatment Plan for Primary Diagnosis: MDD (major depressive disorder), single episode, severe ([place]) Long Term Goal(s): Improvement in symptoms so  as ready for discharge  Short Term Goals: Ability to disclose and discuss suicidal ideas and Ability to demonstrate self-control will improve  Physician Treatment Plan for Secondary Diagnosis: Principal Problem:   MDD (major depressive disorder), single episode, severe (HCC) Active Problems:   Suicide attempt by drug overdose (HCC)   Abdominal pain  Long Term Goal(s): Improvement in symptoms so as ready for discharge  Short Term Goals: Compliance with prescribed medications will improve  I certify that inpatient services furnished can reasonably be expected to improve the patient's condition.    Mordecai RasmussenJohn Clapacs, MD 6/9/20205:24 PM

## 2019-05-10 NOTE — Plan of Care (Signed)
Pt sates she slept "terrible". Pt rates depression 0/10 and anxiety 8/10. Pt denies SI, HI and AVH. Pt has a goal to talk to the doctor about stomach pain. Pt was educated on care plan verbalizes understanding. Collier Bullock RN Problem: Education: Goal: Knowledge of [facility] Education information/materials will improve 05/10/2019 1137 by Kieth Brightly, RN Outcome: Progressing 05/10/2019 1136 by Kieth Brightly, RN Outcome: Progressing Goal: Emotional status will improve 05/10/2019 1137 by Kieth Brightly, RN Outcome: Not Progressing 05/10/2019 1136 by Kieth Brightly, RN Outcome: Progressing Goal: Mental status will improve 05/10/2019 1137 by Kieth Brightly, RN Outcome: Not Progressing 05/10/2019 1136 by Kieth Brightly, RN Outcome: Progressing Goal: Verbalization of understanding the information provided will improve 05/10/2019 1137 by Kieth Brightly, RN Outcome: Progressing 05/10/2019 1136 by Kieth Brightly, RN Outcome: Progressing   Problem: Activity: Goal: Interest or engagement in activities will improve 05/10/2019 1137 by Kieth Brightly, RN Outcome: Progressing 05/10/2019 1136 by Kieth Brightly, RN Outcome: Progressing Goal: Sleeping patterns will improve 05/10/2019 1137 by Kieth Brightly, RN Outcome: Not Progressing 05/10/2019 1136 by Kieth Brightly, RN Outcome: Progressing   Problem: Safety: Goal: Periods of time without injury will increase 05/10/2019 1137 by Kieth Brightly, RN Outcome: Progressing 05/10/2019 1136 by Kieth Brightly, RN Outcome: Progressing   Problem: Coping: Goal: Coping ability will improve 05/10/2019 1137 by Kieth Brightly, RN Outcome: Progressing 05/10/2019 1136 by Kieth Brightly, RN Outcome: Progressing Goal: Will verbalize feelings 05/10/2019 1137 by Kieth Brightly, RN Outcome: Progressing 05/10/2019 1136 by Kieth Brightly, RN Outcome: Progressing   Problem: Health Behavior/Discharge Planning: Goal: Ability to make decisions will  improve 05/10/2019 1137 by Kieth Brightly, RN Outcome: Progressing 05/10/2019 1136 by Kieth Brightly, RN Outcome: Progressing Goal: Compliance with therapeutic regimen will improve 05/10/2019 1137 by Kieth Brightly, RN Outcome: Progressing 05/10/2019 1136 by Kieth Brightly, RN Outcome: Progressing   Problem: Self-Concept: Goal: Will verbalize positive feelings about self 05/10/2019 1137 by Kieth Brightly, RN Outcome: Progressing 05/10/2019 1136 by Kieth Brightly, RN Outcome: Progressing Goal: Level of anxiety will decrease 05/10/2019 1137 by Kieth Brightly, RN Outcome: Not Progressing 05/10/2019 1136 by Kieth Brightly, RN Outcome: Progressing

## 2019-05-10 NOTE — Plan of Care (Signed)
Patient is stable , alert and oriented x 4, responding adequately to medications and maintaining social distance with peers , mood is appropriate considering the circumstances, complain of abdominal  cramps ,patient is provided with 120 ml of ginger ale and tolerated well, no further complains , denies any SI,HI,AVH, denies any depression and anxiety at this time, sleep is continuous and only requiring 15 minutes safety checks , no distress.   Problem: Education: Goal: Knowledge of [facility] Education information/materials will improve Outcome: Progressing Goal: Emotional status will improve Outcome: Progressing Goal: Mental status will improve Outcome: Progressing Goal: Verbalization of understanding the information provided will improve Outcome: Progressing   Problem: Activity: Goal: Interest or engagement in activities will improve Outcome: Progressing Goal: Sleeping patterns will improve Outcome: Progressing   Problem: Safety: Goal: Periods of time without injury will increase Outcome: Progressing   Problem: Coping: Goal: Coping ability will improve Outcome: Progressing Goal: Will verbalize feelings Outcome: Progressing   Problem: Health Behavior/Discharge Planning: Goal: Ability to make decisions will improve Outcome: Progressing Goal: Compliance with therapeutic regimen will improve Outcome: Progressing   Problem: Self-Concept: Goal: Will verbalize positive feelings about self Outcome: Progressing Goal: Level of anxiety will decrease Outcome: Progressing
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
## 2019-05-10 NOTE — [facility] Suicide Risk Assessment (Signed)
[facility]   Nursing information obtained from:  Patient Demographic factors:  Caucasian, Access to firearms, Adolescent or young adult Current Mental Status:  Self-harm thoughts Loss Factors:  Loss of significant relationship(problems with relationship) Historical Factors:  Prior suicide attempts, Family history of mental illness or substance abuse Risk Reduction Factors:  Religious beliefs about death, Positive social support, Living with another person, especially a relative, Positive coping skills or problem solving skills, Positive therapeutic relationship  Total Time spent with patient: 1 hour Principal Problem: MDD (major depressive disorder), single episode, severe (HCC) Diagnosis:  Principal Problem:   MDD (major depressive disorder), single episode, severe (HCC) Active Problems:   Suicide attempt by drug overdose (HCC)   Abdominal pain  Subjective Data: Patient seen and chart reviewed.  Patient admits to having had recent depression and suicidal thoughts.  Recent attempt to overdose a couple weeks ago.  Still feeling down negative and hopeless much of the time.  Not having any current psychotic symptoms.  Not abusing substances regularly.  Open to the idea of treatment.  Continued Clinical Symptoms:  Alcohol Use Disorder Identification Test Final Score (AUDIT): 0 The "Alcohol Use Disorders Identification Test", Guidelines for Use in Primary Care, Second Edition.  World Science writerHealth Organization [facility](WHO). Score between 0-7:  no or low risk or alcohol related problems. Score between 8-15:  moderate risk of alcohol related problems. Score between 16-19:  high risk of alcohol related problems. Score 20 or above:  warrants further diagnostic evaluation for alcohol dependence and treatment.   CLINICAL FACTORS:   Depression:   Anhedonia   Musculoskeletal: Strength & Muscle Tone: decreased Gait & Station: normal Patient leans: N/A  Psychiatric Specialty  Exam: Physical Exam  Nursing note and vitals reviewed. Constitutional: She appears well-developed and well-nourished.  HENT:  Head: Normocephalic and atraumatic.  Eyes: Pupils are equal, round, and reactive to light. Conjunctivae are normal.  Neck: Normal range of motion.  Cardiovascular: Regular rhythm and normal heart sounds.  Respiratory: Effort normal.  GI: Soft.  Musculoskeletal: Normal range of motion.  Neurological: She is alert.  Skin: Skin is warm and dry.  Psychiatric: Her speech is normal and behavior is normal. Judgment and thought content normal. Her mood appears anxious. Her affect is blunt. Cognition and memory are normal.    Review of Systems  Constitutional: Negative.   HENT: Negative.   Eyes: Negative.   Respiratory: Negative.   Cardiovascular: Negative.   Gastrointestinal: Negative.   Musculoskeletal: Negative.   Skin: Negative.   Neurological: Negative.   Psychiatric/Behavioral: Positive for depression and suicidal ideas. Negative for hallucinations, memory loss and substance abuse. The patient is nervous/anxious and has insomnia.     Blood pressure 115/68, pulse 62, temperature 98.5 F (36.9 C), temperature source Oral, resp. rate 18, height 5' (1.524 m), weight 48.1 kg, last menstrual period 04/25/2019, SpO2 100 %.Body mass index is 20.7 kg/m.  General Appearance: Casual  Eye Contact:  Good  Speech:  Clear and Coherent  Volume:  Decreased  Mood:  Depressed and Dysphoric  Affect:  Congruent  Thought Process:  Coherent  Orientation:  Full (Time, Place, and Person)  Thought Content:  Logical  Suicidal Thoughts:  Yes.  without intent/plan  Homicidal Thoughts:  No  Memory:  Immediate;   Fair Recent;   Fair Remote;   Fair  Judgement:  Fair  Insight:  Fair  Psychomotor Activity:  Decreased  Concentration:  Concentration: Fair  Recall:  FiservFair  Fund of Knowledge:  Fair  Language:  Fair  Akathisia:  No  Handed:  Right  AIMS (if indicated):     Assets:   Desire for Improvement Physical Health Resilience  ADL's:  Intact  Cognition:  WNL  Sleep:  Number of Hours: 8.15      COGNITIVE FEATURES THAT CONTRIBUTE TO RISK:  Polarized thinking    SUICIDE RISK:   Mild:  Suicidal ideation of limited frequency, intensity, duration, and specificity.  There are no identifiable plans, no associated intent, mild dysphoria and related symptoms, good self-control (both objective and subjective assessment), few other risk factors, and identifiable protective factors, including available and accessible social support.  PLAN OF CARE: Discussed treatment plan with patient.  Initiate antidepressant medicine.  Continue 15-minute checks.  Engage in individual and group therapy.  Reassess suicidality prior to discharge  I certify that inpatient services furnished can reasonably be expected to improve the patient's condition.   Alethia Berthold, MD 05/10/2019, 5:21 PM

## 2019-05-10 NOTE — Plan of Care (Signed)
Patient newly admitted, hasn't had time to progress.   Problem: Education: Goal: Knowledge of  General Education information/materials will improve Outcome: Not Progressing Goal: Emotional status will improve Outcome: Not Progressing Goal: Mental status will improve Outcome: Not Progressing Goal: Verbalization of understanding the information provided will improve Outcome: Not Progressing   Problem: Activity: Goal: Interest or engagement in activities will improve Outcome: Not Progressing Goal: Sleeping patterns will improve Outcome: Not Progressing   Problem: Safety: Goal: Periods of time without injury will increase Outcome: Not Progressing   Problem: Coping: Goal: Coping ability will improve Outcome: Not Progressing Goal: Will verbalize feelings Outcome: Not Progressing   Problem: Health Behavior/Discharge Planning: Goal: Ability to make decisions will improve Outcome: Not Progressing Goal: Compliance with therapeutic regimen will improve Outcome: Not Progressing   Problem: Self-Concept: Goal: Will verbalize positive feelings about self Outcome: Not Progressing Goal: Level of anxiety will decrease Outcome: Not Progressing

## 2019-05-10 NOTE — Progress Notes (Signed)
D - Patient was in her room upon arrival to the unit. Patient was pleasant during assessment. Patient denies SI/HI/AVh and pain. Patient endorses anxiety and depression rating them 6/10. Patient was admitted today and was isolative to her room other than getting snack and taking a phone call.   A - Patient didn't have medication scheduled this evening. Patient given education. Patient given support and encouragement to be active in her treatment plan. Patient informed to let staff know if there are any issues or problems on the unit.   R - Patient being monitored Q 15 minutes for safety per unit protocol. Patient remains safe on the unit.

## 2019-05-10 NOTE — Progress Notes (Signed)
NUTRITION ASSESSMENT RD working remotely.  Pt identified as at risk on the Malnutrition Screen Tool  INTERVENTION: Provide Ensure Enlive po BID, each supplement provides 350 kcal and 20 grams of protein.  NUTRITION DIAGNOSIS: Unintentional weight loss related to sub-optimal intake as evidenced by pt report.   Goal: Pt to meet >/= 90% of their estimated nutrition needs.  Monitor:  PO intake  Assessment:  18 y.o. female with no significant PMHx admitted following suicide attempt by drug overdose with MDD.  Patient has been experiencing N/V and abdominal pain for about one year now per chart. Symptoms have worsened over the past two weeks since her overdose. Eating worsens symptoms and per chart nothing makes symptoms better. Unsure how well patient is eating as there is no meal completion documented at this time. Last BM 05/09/2019 per chart. Skin is intact. Patient does have ecchymosis to right thigh.  No weight history in chart to trend. Patient is currently 48.1 kg (10.62%ile weight-for-age). Her BMI-for-age is 39.16%ile, which is normal weight. Used CDC Girls 2-20 years growth chart.  Medications reviewed and include: pantoprazole.  Labs reviewed.  Height: Ht Readings from Last 1 Encounters:  05/09/19 5' (1.524 m) (5 %, Z= -1.67)*   * Growth percentiles are based on CDC (Girls, 2-20 Years) data.   Weight: Wt Readings from Last 1 Encounters:  05/09/19 48.1 kg (11 %, Z= -1.25)*   * Growth percentiles are based on CDC (Girls, 2-20 Years) data.   Weight Hx: Wt Readings from Last 10 Encounters:  05/09/19 48.1 kg (11 %, Z= -1.25)*  05/09/19 48.5 kg (12 %, Z= -1.17)*   * Growth percentiles are based on CDC (Girls, 2-20 Years) data.    BMI:  Body mass index is 20.7 kg/m. Pt meets criteria for normal weight based on current BMI.  Estimated Nutritional Needs: Kcal: 25-30 kcal/kg Protein: > 1 gram protein/kg Fluid: 1 ml/kcal  Diet Order:  Diet Order            Diet  regular Room service appropriate? Yes; Fluid consistency: Thin  Diet effective now             Pt is also offered choice of unit snacks mid-morning and mid-afternoon.   Willey Blade, MS, [place], LDN Office: [phone] Pager: [phone] After Hours/Weekend Pager: [phone]
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
## 2019-05-10 NOTE — BHH Group Notes (Signed)
Feelings Around Diagnosis 05/10/2019 1PM  Type of Therapy/Topic:  Group Therapy:  Feelings about Diagnosis  Participation Level:  Did Not Attend   Description of Group:   This group will allow patients to explore their thoughts and feelings about diagnoses they have received. Patients will be guided to explore their level of understanding and acceptance of these diagnoses. Facilitator will encourage patients to process their thoughts and feelings about the reactions of others to their diagnosis and will guide patients in identifying ways to discuss their diagnosis with significant others in their lives. This group will be process-oriented, with patients participating in exploration of their own experiences, giving and receiving support, and processing challenge from other group members.   Therapeutic Goals: 1. Patient will demonstrate understanding of diagnosis as evidenced by identifying two or more symptoms of the disorder 2. Patient will be able to express two feelings regarding the diagnosis 3. Patient will demonstrate their ability to communicate their needs through discussion and/or role play  Summary of Patient Progress:       Therapeutic Modalities:   Cognitive Behavioral Therapy Brief Therapy Feelings Identification    Yvette Rack, LCSW 05/10/2019 2:59 PM

## 2019-05-10 NOTE — Progress Notes (Signed)
Recreation Therapy Notes  Date: 05/10/2019  Time: 9:30 am   Location: Craft room   Behavioral response: N/A   Intervention Topic: Happiness  Discussion/Intervention: Patient did not attend group.   Clinical Observations/Feedback:  Patient did not attend group.   Waynette Towers LRT/CTRS        Ashley Byrd 05/10/2019 11:27 AM 

## 2019-05-11 MED ORDER — FLUOXETINE HCL 10 MG PO CAPS: 10.0000 mg | ORAL_CAPSULE | Freq: Every day | ORAL | 0 refills | Status: DC

## 2019-05-11 NOTE — Progress Notes (Signed)
Recreation Therapy Notes  Date: 05/11/2019  Time: 9:30 am   Location: Craft room   Behavioral response: N/A   Intervention Topic: Self-esteem  Discussion/Intervention: Patient did not attend group.   Clinical Observations/Feedback:  Patient did not attend group.   Roth Ress LRT/CTRS        Nanie Dunkleberger 05/11/2019 10:54 AM

## 2019-05-11 NOTE — Tx Team (Signed)
Interdisciplinary Treatment and Diagnostic Plan Update  05/11/2019 Time of Session: 2:30p Ashley MossKristyn B Byrd MRN: [id number]  Principal Diagnosis: MDD (major depressive disorder), single episode, severe (HCC)  Secondary Diagnoses: Principal Problem:   MDD (major depressive disorder), single episode, severe (HCC) Active Problems:   Suicide attempt by drug overdose (HCC)   Abdominal pain   Current Medications:  Current Facility-Administered Medications  Medication Dose Route Frequency Provider Last Rate Last Dose  . acetaminophen (TYLENOL) tablet 650 mg  650 mg Oral Q6H PRN Mariel CraftMaurer, Sheila M, MD      . alum & mag hydroxide-simeth (MAALOX/MYLANTA) 200-200-20 MG/5ML suspension 30 mL  30 mL Oral Q4H PRN Mariel CraftMaurer, Sheila M, MD      . diphenhydrAMINE (BENADRYL) capsule 50 mg  50 mg Oral Q8H PRN Mariel CraftMaurer, Sheila M, MD   50 mg at 05/11/19 0141  . feeding supplement (ENSURE ENLIVE) (ENSURE ENLIVE) liquid 237 mL  237 mL Oral BID BM Clapacs, John T, MD      . FLUoxetine (PROZAC) capsule 10 mg  10 mg Oral Daily Clapacs, Jackquline DenmarkJohn T, MD   10 mg at 05/11/19 0847  . hydrOXYzine (ATARAX/VISTARIL) tablet 10 mg  10 mg Oral TID PRN Mariel CraftMaurer, Sheila M, MD   10 mg at 05/10/19 1859  . magnesium hydroxide (MILK OF MAGNESIA) suspension 30 mL  30 mL Oral Daily PRN Mariel CraftMaurer, Sheila M, MD   30 mL at 05/11/19 0141  . Norgestimate-Ethinyl Estradiol Triphasic 0.18/0.215/0.25 MG-35 MCG tablet 1 tablet  1 tablet Oral Daily Clapacs, Jackquline DenmarkJohn T, MD   1 tablet at 05/11/19 0947  . ondansetron (ZOFRAN) tablet 4 mg  4 mg Oral Q8H PRN Clapacs, John T, MD      . pantoprazole (PROTONIX) EC tablet 40 mg  40 mg Oral Daily Clapacs, Jackquline DenmarkJohn T, MD   40 mg at 05/11/19 0847   PTA Medications: Medications Prior to Admission  Medication Sig Dispense Refill Last Dose  . omeprazole (PRILOSEC OTC) 20 MG tablet Take 1 tablet (20 mg total) by mouth daily. 28 tablet 1   . ondansetron (ZOFRAN ODT) 4 MG disintegrating tablet Allow 1-2 tablets to dissolve in your  mouth every 8 hours as needed for nausea/vomiting 30 tablet 0   . sucralfate (CARAFATE) 1 g tablet Take 1 tablet (1 g total) by mouth 4 (four) times daily as needed (for abdominal discomfort, nausea, and/or vomiting). 30 tablet 1   . TRI-PREVIFEM 0.18/0.215/0.25 MG-35 MCG tablet Take 1 tablet by mouth daily. as directed   05/06/2019 at 2000    Patient Stressors: Health problems Marital or family conflict Occupational concerns  Patient Strengths: Ability for insight General fund of knowledge Motivation for treatment/growth  Treatment Modalities: Medication Management, Group therapy, Case management,  1 to 1 session with clinician, Psychoeducation, Recreational therapy.   Physician Treatment Plan for Primary Diagnosis: MDD (major depressive disorder), single episode, severe (HCC) Long Term Goal(s): Improvement in symptoms so as ready for discharge Improvement in symptoms so as ready for discharge   Short Term Goals: Ability to disclose and discuss suicidal ideas Ability to demonstrate self-control will improve Compliance with prescribed medications will improve  Medication Management: Evaluate patient's response, side effects, and tolerance of medication regimen.  Therapeutic Interventions: 1 to 1 sessions, Unit Group sessions and Medication administration.  Evaluation of Outcomes: Progressing  Physician Treatment Plan for Secondary Diagnosis: Principal Problem:   MDD (major depressive disorder), single episode, severe (HCC) Active Problems:   Suicide attempt by drug overdose (HCC)   Abdominal  pain  Long Term Goal(s): Improvement in symptoms so as ready for discharge Improvement in symptoms so as ready for discharge   Short Term Goals: Ability to disclose and discuss suicidal ideas Ability to demonstrate self-control will improve Compliance with prescribed medications will improve     Medication Management: Evaluate patient's response, side effects, and tolerance of medication  regimen.  Therapeutic Interventions: 1 to 1 sessions, Unit Group sessions and Medication administration.  Evaluation of Outcomes: Progressing   RN Treatment Plan for Primary Diagnosis: MDD (major depressive disorder), single episode, severe ([place]) Long Term Goal(s): Knowledge of disease and therapeutic regimen to maintain health will improve  Short Term Goals: Ability to demonstrate self-control, Ability to verbalize feelings will improve, Ability to disclose and discuss suicidal ideas and Ability to identify and develop effective coping behaviors will improve  Medication Management: RN will administer medications as ordered by provider, will assess and evaluate patient's response and provide education to patient for prescribed medication. RN will report any adverse and/or side effects to prescribing provider.  Therapeutic Interventions: 1 on 1 counseling sessions, Psychoeducation, Medication administration, Evaluate responses to treatment, Monitor vital signs and CBGs as ordered, Perform/monitor CIWA, COWS, AIMS and Fall Risk screenings as ordered, Perform wound care treatments as ordered.  Evaluation of Outcomes: Progressing   LCSW Treatment Plan for Primary Diagnosis: MDD (major depressive disorder), single episode, severe ([place]) Long Term Goal(s): Safe transition to appropriate next level of care at discharge, Engage patient in therapeutic group addressing interpersonal concerns.  Short Term Goals: Engage patient in aftercare planning with referrals and resources, Increase social support, Increase ability to appropriately verbalize feelings, Increase emotional regulation and Facilitate acceptance of mental health diagnosis and concerns  Therapeutic Interventions: Assess for all discharge needs, 1 to 1 time with Social worker, Explore available resources and support systems, Assess for adequacy in community support network, Educate family and significant other(s) on suicide prevention,  Complete Psychosocial Assessment, Interpersonal group therapy.  Evaluation of Outcomes: Progressing   Progress in Treatment: Attending groups: No. Participating in groups: No. Taking medication as prescribed: Yes. Toleration medication: Yes. Family/Significant other contact made: Yes, individual(s) contacted:  SPE completed with the patient's mother. Patient understands diagnosis: Yes. Discussing patient identified problems/goals with staff: Yes. Medical problems stabilized or resolved: Yes. Denies suicidal/homicidal ideation: Yes. Issues/concerns per patient self-inventory: No. Other: none  New problem(s) identified: No, Describe:  none  New Short Term/Long Term Goal(s): medication management for mood stabilization; elimination of SI thoughts; development of comprehensive mental wellness plan.  Patient Goals:  "just want my stomach to get better"  Discharge Plan or Barriers: Patient reports plans to return to her friends house at discharge. Patient reports that she wants to begin individual with [facility].   Reason for Continuation of Hospitalization: Anxiety Depression Medical Issues Medication stabilization  Estimated Length of Stay: 1-5 days  Attendees: Patient: Ashley Byrd 05/11/2019 3:33 PM  Physician: Dr. Weber Cooks, MD 05/11/2019 3:33 PM  Nursing:  05/11/2019 3:33 PM  RN Care Manager: 05/11/2019 3:33 PM  Social Worker: Assunta Curtis, LCSW 05/11/2019 3:33 PM  Recreational Therapist: Devin Going, LRT 05/11/2019 3:33 PM  Other: Sanjuana Kava, LCSW 05/11/2019 3:33 PM  Other: Evalina Field, LCSW 05/11/2019 3:33 PM  Other: 05/11/2019 3:33 PM    Scribe for Treatment Team: Rozann Lesches, LCSW 05/11/2019 3:33 PM

## 2019-05-11 NOTE — Plan of Care (Signed)
D- Patient alert and oriented. Patient presents in a pleasant mood on assessment stating that she slept good last night and had no major complaints to voice to this Probation officer. Patient denies any signs/symptoms of depression, however, she rates her anxiety a "5/10" stating that "being here" is why she's feeling this way.  Patient also denies SI, HI, AVH, and pain at this time. Patient's goal for today is to "see my mom and family", in which she will "feel better" in order to achieve her goal.  A- Scheduled medications administered to patient, per MD orders. Support and encouragement provided.  Routine safety checks conducted every 15 minutes.  Patient informed to notify staff with problems or concerns.  R- No adverse drug reactions noted. Patient contracts for safety at this time. Patient compliant with medications and treatment plan. Patient receptive, calm, and cooperative. Patient interacts well with others on the unit.  Patient remains safe at this time.  Problem: Education: Goal: Knowledge of [facility] Education information/materials will improve Outcome: Progressing Goal: Emotional status will improve Outcome: Progressing Goal: Mental status will improve Outcome: Progressing Goal: Verbalization of understanding the information provided will improve Outcome: Progressing   Problem: Activity: Goal: Interest or engagement in activities will improve Outcome: Progressing Goal: Sleeping patterns will improve Outcome: Progressing   Problem: Safety: Goal: Periods of time without injury will increase Outcome: Progressing   Problem: Coping: Goal: Coping ability will improve Outcome: Progressing Goal: Will verbalize feelings Outcome: Progressing   Problem: Health Behavior/Discharge Planning: Goal: Ability to make decisions will improve Outcome: Progressing Goal: Compliance with therapeutic regimen will improve Outcome: Progressing   Problem: Self-Concept: Goal: Will verbalize  positive feelings about self Outcome: Progressing Goal: Level of anxiety will decrease Outcome: Progressing

## 2019-05-11 NOTE — [facility] Group Notes (Signed)
LCSW Group Therapy Note  05/11/2019 1:00 PM  Type of Therapy/Topic:  Group Therapy:  Emotion Regulation  Participation Level:  Did Not Attend   Description of Group:   The purpose of this group is to assist patients in learning to regulate negative emotions and experience positive emotions. Patients will be guided to discuss ways in which they have been vulnerable to their negative emotions. These vulnerabilities will be juxtaposed with experiences of positive emotions or situations, and patients will be challenged to use positive emotions to combat negative ones. Special emphasis will be placed on coping with negative emotions in conflict situations, and patients will process healthy conflict resolution skills.  Therapeutic Goals: 1. Patient will identify two positive emotions or experiences to reflect on in order to balance out negative emotions 2. Patient will label two or more emotions that they find the most difficult to experience 3. Patient will demonstrate positive conflict resolution skills through discussion and/or role plays  Summary of Patient Progress: X  Therapeutic Modalities:   Cognitive Behavioral Therapy Feelings Identification Dialectical Behavioral Therapy  Assunta Curtis, MSW, LCSW 05/11/2019 11:48 AM

## 2019-05-11 NOTE — Progress Notes (Signed)
[facility] MD Progress Note  05/11/2019 4:57 PM Ashley Byrd  MRN:  [id number] Subjective: Patient seen and also met with treatment team.  Patient reports that she is feeling better but is very tearful on the unit.  Says that she feels even worse at times.  Passive suicidal thoughts without intent or plan.  Tolerating medicine without difficulty however and has not been showing any violent or aggressive behavior and no psychosis. Principal Problem: MDD (major depressive disorder), single episode, severe (Ashley Byrd) Diagnosis: Principal Problem:   MDD (major depressive disorder), single episode, severe (Ashley Byrd) Active Problems:   Suicide attempt by drug overdose (Ashley Byrd)   Abdominal pain  Total Time spent with patient: 20 minutes  Past Psychiatric History: No previous psychiatric history although she has reported recent suicide attempts  Past Medical History: History reviewed. No pertinent past medical history. History reviewed. No pertinent surgical history. Family History: History reviewed. No pertinent family history. Family Psychiatric  History: See previous Social History:  Social History   Substance and Sexual Activity  Alcohol Use Not Currently     Social History   Substance and Sexual Activity  Drug Use Never    Social History   Socioeconomic History  . Marital status: Single    Spouse name: Not on file  . Number of children: Not on file  . Years of education: Not on file  . Highest education level: Not on file  Occupational History  . Not on file  Social Needs  . Financial resource strain: Not on file  . Food insecurity:    Worry: Not on file    Inability: Not on file  . Transportation needs:    Medical: Not on file    Non-medical: Not on file  Tobacco Use  . Smoking status: Never Smoker  . Smokeless tobacco: Never Used  Substance and Sexual Activity  . Alcohol use: Not Currently  . Drug use: Never  . Sexual activity: Not on file  Lifestyle  . Physical activity:     Days per week: Not on file    Minutes per session: Not on file  . Stress: Not on file  Relationships  . Social connections:    Talks on phone: Not on file    Gets together: Not on file    Attends religious service: Not on file    Active member of club or organization: Not on file    Attends meetings of clubs or organizations: Not on file    Relationship status: Not on file  Other Topics Concern  . Not on file  Social History Narrative  . Not on file   Additional Social History:    History of alcohol / drug use?: No history of alcohol / drug abuse                    Sleep: Fair  Appetite:  Fair  Current Medications: Current Facility-Administered Medications  Medication Dose Route Frequency Provider Last Rate Last Dose  . acetaminophen (TYLENOL) tablet 650 mg  650 mg Oral Q6H PRN Lavella Hammock, MD      . alum & mag hydroxide-simeth (MAALOX/MYLANTA) 200-200-20 MG/5ML suspension 30 mL  30 mL Oral Q4H PRN Lavella Hammock, MD      . diphenhydrAMINE (BENADRYL) capsule 50 mg  50 mg Oral Q8H PRN Lavella Hammock, MD   50 mg at 05/11/19 0141  . feeding supplement (ENSURE ENLIVE) (ENSURE ENLIVE) liquid 237 mL  237 mL Oral BID BM  Christana Angelica, Madie Reno, MD      . FLUoxetine (PROZAC) capsule 10 mg  10 mg Oral Daily Chuck Caban, Madie Reno, MD   10 mg at 05/11/19 0847  . hydrOXYzine (ATARAX/VISTARIL) tablet 10 mg  10 mg Oral TID PRN Lavella Hammock, MD   10 mg at 05/10/19 1859  . magnesium hydroxide (MILK OF MAGNESIA) suspension 30 mL  30 mL Oral Daily PRN Lavella Hammock, MD   30 mL at 05/11/19 0141  . Norgestimate-Ethinyl Estradiol Triphasic 0.18/0.215/0.25 MG-35 MCG tablet 1 tablet  1 tablet Oral Daily Luz Burcher, Madie Reno, MD   1 tablet at 05/11/19 0947  . ondansetron (ZOFRAN) tablet 4 mg  4 mg Oral Q8H PRN Audryna Wendt T, MD      . pantoprazole (PROTONIX) EC tablet 40 mg  40 mg Oral Daily Whyatt Klinger, Madie Reno, MD   40 mg at 05/11/19 0847    Lab Results: No results found for this or any previous  visit (from the past 48 hour(s)).  Blood Alcohol level:  Lab Results  Component Value Date   ETH <10 17/51/0258    Metabolic Disorder Labs: No results found for: HGBA1C, MPG No results found for: PROLACTIN No results found for: CHOL, TRIG, HDL, CHOLHDL, VLDL, LDLCALC  Physical Findings: AIMS: Facial and Oral Movements Muscles of Facial Expression: None, normal Lips and Perioral Area: None, normal Jaw: None, normal Tongue: None, normal,Extremity Movements Upper (arms, wrists, hands, fingers): None, normal Lower (legs, knees, ankles, toes): None, normal, Trunk Movements Neck, shoulders, hips: None, normal, Overall Severity Severity of abnormal movements (highest score from questions above): None, normal Incapacitation due to abnormal movements: None, normal Patient's awareness of abnormal movements (rate only patient's report): No Awareness, Dental Status Current problems with teeth and/or dentures?: No Does patient usually wear dentures?: No  CIWA:    COWS:     Musculoskeletal: Strength & Muscle Tone: within normal limits Gait & Station: normal Patient leans: N/A  Psychiatric Specialty Exam: Physical Exam  Nursing note and vitals reviewed. Constitutional: She appears well-developed and well-nourished.  HENT:  Head: Normocephalic and atraumatic.  Eyes: Pupils are equal, round, and reactive to light. Conjunctivae are normal.  Neck: Normal range of motion.  Cardiovascular: Regular rhythm and normal heart sounds.  Respiratory: Effort normal.  GI: Soft.  Musculoskeletal: Normal range of motion.  Neurological: She is alert.  Skin: Skin is warm and dry.  Psychiatric: Her speech is normal. Her mood appears anxious. She is agitated. She is not aggressive. Cognition and memory are impaired. She expresses impulsivity. She expresses no suicidal ideation.    Review of Systems  Constitutional: Negative.   HENT: Negative.   Eyes: Negative.   Respiratory: Negative.    Cardiovascular: Negative.   Gastrointestinal: Negative.   Musculoskeletal: Negative.   Skin: Negative.   Neurological: Negative.   Psychiatric/Behavioral: Positive for depression. Negative for hallucinations, memory loss, substance abuse and suicidal ideas. The patient is nervous/anxious. The patient does not have insomnia.     Blood pressure (!) 104/51, pulse 61, temperature 98.6 F (37 C), temperature source Oral, resp. rate 16, height 5' (1.524 m), weight 48.1 kg, last menstrual period 04/25/2019, SpO2 100 %.Body mass index is 20.7 kg/m.  General Appearance: Casual  Eye Contact:  Good  Speech:  Clear and Coherent  Volume:  Normal  Mood:  Euthymic  Affect:  Congruent  Thought Process:  Goal Directed  Orientation:  Full (Time, Place, and Person)  Thought Content:  Logical  Suicidal Thoughts:  No  Homicidal Thoughts:  No  Memory:  Immediate;   Fair Recent;   Fair Remote;   Fair  Judgement:  Fair  Insight:  Fair  Psychomotor Activity:  Decreased  Concentration:  Concentration: Fair  Recall:  [employer] of Knowledge:  Fair  Language:  Fair  Akathisia:  No  Handed:  Right  AIMS (if indicated):     Assets:  Desire for Improvement Housing Physical Health Resilience  ADL's:  Intact  Cognition:  WNL  Sleep:  Number of Hours: 8     Treatment Plan Summary: Daily contact with patient to assess and evaluate symptoms and progress in treatment, Medication management and Plan Patient with recent suicide attempts and multiple symptoms of depression.  Been in the hospital 2 days.  Just starting medication.  Seems to be stabilizing.  Patient had requested discharge today but I think the safer thing would be to observe at least another night to make sure that she is stable and tolerating medication.  Plan on likely discharge tomorrow with follow-up to be referred locally.  Alethia Berthold, MD 05/11/2019, 4:57 PM

## 2019-05-12 MED ORDER — FLUOXETINE HCL 10 MG PO CAPS: 10.0000 mg | ORAL_CAPSULE | Freq: Every day | ORAL | 1 refills | Status: DC

## 2019-05-12 NOTE — Plan of Care (Signed)
Patient said she had a good day and is feeling better. Patient is hopeful to be getting out of here soon.   Problem: Education: Goal: Emotional status will improve Outcome: Progressing Goal: Mental status will improve Outcome: Progressing

## 2019-05-12 NOTE — Discharge Summary (Signed)
Physician Discharge Summary Note  Patient:  Ashley Byrd is an 18 y.o., female MRN:  [id number] DOB:  Nov 26, 2001 Patient phone:  [phone] (home)  Patient address:   [address] [place] [address],  Total Time spent with patient: 45 minutes  Date of Admission:  05/09/2019 Date of Discharge: May 12, 2019  Reason for Admission: Admitted to the emergency room because of concerns about suicidal statements  Principal Problem: MDD (major depressive disorder), single episode, severe ([place]) Discharge Diagnoses: Principal Problem:   MDD (major depressive disorder), single episode, severe ([place]) Active Problems:   Suicide attempt by drug overdose ([place])   Abdominal pain   Past Psychiatric History: Past history of self injury.  Had not been involved in active mental health treatment  Past Medical History: History reviewed. No pertinent past medical history. History reviewed. No pertinent surgical history. Family History: History reviewed. No pertinent family history. Family Psychiatric  History: None reported Social History:  Social History   Substance and Sexual Activity  Alcohol Use Not Currently     Social History   Substance and Sexual Activity  Drug Use Never    Social History   Socioeconomic History  . Marital status: Single    Spouse name: Not on file  . Number of children: Not on file  . Years of education: Not on file  . Highest education level: Not on file  Occupational History  . Not on file  Social Needs  . Financial resource strain: Not on file  . Food insecurity    Worry: Not on file    Inability: Not on file  . Transportation needs    Medical: Not on file    Non-medical: Not on file  Tobacco Use  . Smoking status: Never Smoker  . Smokeless tobacco: Never Used  Substance and Sexual Activity  . Alcohol use: Not Currently  . Drug use: Never  . Sexual activity: Not on file  Lifestyle  . Physical activity    Days per week: Not on file     Minutes per session: Not on file  . Stress: Not on file  Relationships  . Social Herbalist on phone: Not on file    Gets together: Not on file    Attends religious service: Not on file    Active member of club or organization: Not on file    Attends meetings of clubs or organizations: Not on file    Relationship status: Not on file  Other Topics Concern  . Not on file  Social History Narrative  . Not on file    Hospital Course: Patient admitted to the psychiatric ward.  15-minute checks employed.  Patient did not display any suicidal or violent behavior.  She met with treatment team and was appropriate in treatment care planning.  Started on low-dose serotonin reuptake inhibitor.  Tolerated medicine fine.  Modest doses of as needed medicines for her GI complaints were used sparingly.  Patient was not observed to be throwing up or be in much pain during her hospital stay.  Psychoeducation and supportive counseling completed.  She attended groups and showed good insight.  At the time of discharge she is denying any suicidal ideation and agrees to recommendations for outpatient follow-up for depression and anxiety.  Physical Findings: AIMS: Facial and Oral Movements Muscles of Facial Expression: None, normal Lips and Perioral Area: None, normal Jaw: None, normal Tongue: None, normal,Extremity Movements Upper (arms, wrists, hands, fingers): None, normal Lower (  legs, knees, ankles, toes): None, normal, Trunk Movements Neck, shoulders, hips: None, normal, Overall Severity Severity of abnormal movements (highest score from questions above): None, normal Incapacitation due to abnormal movements: None, normal Patient's awareness of abnormal movements (rate only patient's report): No Awareness, Dental Status Current problems with teeth and/or dentures?: No Does patient usually wear dentures?: No  CIWA:    COWS:     Musculoskeletal: Strength & Muscle Tone: within normal  limits Gait & Station: normal Patient leans: N/A  Psychiatric Specialty Exam: Physical Exam  Nursing note and vitals reviewed. Constitutional: She appears well-developed and well-nourished.  HENT:  Head: Normocephalic and atraumatic.  Eyes: Pupils are equal, round, and reactive to light. Conjunctivae are normal.  Neck: Normal range of motion.  Cardiovascular: Regular rhythm and normal heart sounds.  Respiratory: Effort normal. No respiratory distress.  GI: Soft.  Musculoskeletal: Normal range of motion.  Neurological: She is alert.  Skin: Skin is warm and dry.  Psychiatric: She has a normal mood and affect. Her behavior is normal. Judgment and thought content normal.    Review of Systems  Constitutional: Negative.   HENT: Negative.   Eyes: Negative.   Respiratory: Negative.   Cardiovascular: Negative.   Gastrointestinal: Negative.   Musculoskeletal: Negative.   Skin: Negative.   Neurological: Negative.   Psychiatric/Behavioral: Negative.     Blood pressure 108/64, pulse 64, temperature 98.2 F (36.8 C), temperature source Oral, resp. rate 16, height 5' (1.524 m), weight 48.1 kg, last menstrual period 04/25/2019, SpO2 100 %.Body mass index is 20.7 kg/m.  General Appearance: Casual  Eye Contact:  Good  Speech:  Clear and Coherent  Volume:  Normal  Mood:  Euthymic  Affect:  Constricted  Thought Process:  Goal Directed  Orientation:  Full (Time, Place, and Person)  Thought Content:  Logical  Suicidal Thoughts:  No  Homicidal Thoughts:  No  Memory:  Immediate;   Fair Recent;   Fair Remote;   Fair  Judgement:  Good  Insight:  Fair  Psychomotor Activity:  Normal  Concentration:  Concentration: Fair  Recall:  [employer] of Knowledge:  Fair  Language:  Fair  Akathisia:  No  Handed:  Right  AIMS (if indicated):     Assets:  Desire for Improvement Physical Health Resilience Social Support  ADL's:  Intact  Cognition:  WNL  Sleep:  Number of Hours: 7.25      Have you used any form of tobacco in the last 30 days? (Cigarettes, Smokeless Tobacco, Cigars, and/or Pipes): No  Has this patient used any form of tobacco in the last 30 days? (Cigarettes, Smokeless Tobacco, Cigars, and/or Pipes) Yes, No  Blood Alcohol level:  Lab Results  Component Value Date   ETH <10 13/07/6577    Metabolic Disorder Labs:  No results found for: HGBA1C, MPG No results found for: PROLACTIN No results found for: CHOL, TRIG, HDL, CHOLHDL, VLDL, LDLCALC  See Psychiatric Specialty Exam and Suicide Risk Assessment completed by Attending Physician prior to discharge.  Discharge destination:  Home  Is patient on multiple antipsychotic therapies at discharge:  No   Has Patient had three or more failed trials of antipsychotic monotherapy by history:  No  Recommended Plan for Multiple Antipsychotic Therapies: NA  Discharge Instructions    Diet - low sodium heart healthy   Complete by: As directed    Increase activity slowly   Complete by: As directed      Allergies as of 05/12/2019   No  Known Allergies     Medication List    STOP taking these medications   sucralfate 1 g tablet Commonly known as: Carafate     TAKE these medications     Indication  FLUoxetine 10 MG capsule Commonly known as: PROZAC Take 1 capsule (10 mg total) by mouth daily.  Indication: Depression   omeprazole 20 MG tablet Commonly known as: PriLOSEC OTC Take 1 tablet (20 mg total) by mouth daily.  Indication: Indigestion   ondansetron 4 MG disintegrating tablet Commonly known as: Zofran ODT Allow 1-2 tablets to dissolve in your mouth every 8 hours as needed for nausea/vomiting  Indication: Nausea/Vomiting   Tri-Previfem 0.18/0.215/0.25 MG-35 MCG tablet Generic drug: Norgestimate-Ethinyl Estradiol Triphasic Take 1 tablet by mouth daily. as directed  Indication: Birth Control Treatment, Oral birth control      Follow-up Information    [facility] Follow up on 05/18/2019.    Why: Beverly Sessions will be contacting you on Wednesday, June 17th at 3:00pm for a telephone appointment.  Thank you. Contact information: [address] [place] [address] [phone]           Follow-up recommendations:  Activity:  Activity as tolerated Diet:  Regular diet Other:  Patient encouraged to follow-up with [facility].  Temporary prescriptions given for current medicine.  Side effects of medicine discussed patient agrees to plan.  No signs of acute suicidality at discharge  Comments: Patient is not in acute danger to self or others no longer meets commitment criteria  Signed: Alethia Berthold, MD 05/12/2019, 5:37 PM

## 2019-05-12 NOTE — BHH Suicide Risk Assessment (Signed)
[facility] Discharge Suicide Risk Assessment   Principal Problem: MDD (major depressive disorder), single episode, severe ([place]) Discharge Diagnoses: Principal Problem:   MDD (major depressive disorder), single episode, severe ([place]) Active Problems:   Suicide attempt by drug overdose ([place])   Abdominal pain   Total Time spent with patient: 45 minutes  Musculoskeletal: Strength & Muscle Tone: within normal limits Gait & Station: normal Patient leans: N/A  Psychiatric Specialty Exam: Review of Systems  Constitutional: Negative.   HENT: Negative.   Eyes: Negative.   Respiratory: Negative.   Cardiovascular: Negative.   Gastrointestinal: Negative.   Musculoskeletal: Negative.   Skin: Negative.   Neurological: Negative.   Psychiatric/Behavioral: Negative.     Blood pressure 108/64, pulse 64, temperature 98.2 F (36.8 C), temperature source Oral, resp. rate 16, height 5' (1.524 m), weight 48.1 kg, last menstrual period 04/25/2019, SpO2 100 %.Body mass index is 20.7 kg/m.  General Appearance: Casual  Eye Contact::  Good  Speech:  Normal Rate409  Volume:  Normal  Mood:  Euthymic  Affect:  Congruent  Thought Process:  Goal Directed  Orientation:  Full (Time, Place, and Person)  Thought Content:  Logical  Suicidal Thoughts:  No  Homicidal Thoughts:  No  Memory:  Immediate;   Fair Recent;   Fair Remote;   Fair  Judgement:  Fair  Insight:  Fair  Psychomotor Activity:  Decreased  Concentration:  Fair  Recall:  [employer] of Knowledge:Fair  Language: Fair  Akathisia:  No  Handed:  Right  AIMS (if indicated):     Assets:  Desire for Improvement Housing Physical Health Resilience Social Support  Sleep:  Number of Hours: 7.25  Cognition: WNL  ADL's:  Intact   Mental Status Per Nursing Assessment::   On Admission:  Self-harm thoughts  Demographic Factors:  Caucasian  Loss Factors: Financial problems/change in socioeconomic status  Historical Factors: Prior suicide  attempts and Impulsivity  Risk Reduction Factors:   Sense of responsibility to family, Religious beliefs about death, Employed, Living with another person, especially a relative and Positive social support  Continued Clinical Symptoms:  Depression:   Impulsivity  Cognitive Features That Contribute To Risk:  Polarized thinking    Suicide Risk:  Minimal: No identifiable suicidal ideation.  Patients presenting with no risk factors but with morbid ruminations; may be classified as minimal risk based on the severity of the depressive symptoms    Plan Of Care/Follow-up recommendations:  Activity:  Activity as tolerated Diet:  Regular diet Other:  Patient will be given prescription for medication and will be referred to appropriate local mental health providers.  Psychoeducation completed about the importance of staying focused on positive outcomes and on not giving into suicidal ideation.  Patient was attentive and agreed to plan.  No longer meets commitment criteria.  Alethia Berthold, MD 05/12/2019, 9:08 AM

## 2019-05-12 NOTE — Progress Notes (Signed)
  [facility] Adult Case Management Discharge Plan :  Will you be returning to the same living situation after discharge:  Yes,  lives with friends At discharge, do you have transportation home?: Yes,  sister will pick up at 12pm Do you have the ability to pay for your medications: Yes,  insurance  Release of information consent forms completed and in the chart;  Patient's signature needed at discharge.  Patient to Follow up at: Follow-up Information    [facility] Follow up on 05/18/2019.   Why: Beverly Sessions will be contacting you on Wednesday, June 17th at 3:00pm for a telephone appointment.  Thank you. Contact information: [address] [place] [address] [phone]           Next level of care provider has access to [place] and Suicide Prevention discussed: Yes,  Julieanne Cotton, mother  Have you used any form of tobacco in the last 30 days? (Cigarettes, Smokeless Tobacco, Cigars, and/or Pipes): No  Has patient been referred to the Quitline?: N/A patient is not a smoker  Patient has been referred for addiction treatment: N/A  Yvette Rack, LCSW 05/12/2019, 10:09 AM

## 2019-05-12 NOTE — Progress Notes (Signed)
Pt was educated on dc instructions and verbalized understanding. Pt received transition record, suicide risk assessment, AVS, belongings, prescriptions and a meds. Pt said she could go to the health department to get set up with a provider. Pt denied SI and HI. Pt was dc to her sister. Collier Bullock RN

## 2019-05-12 NOTE — Plan of Care (Signed)
Pt slept "good". Pt denies SI, HI and AVH. Pt has a goal to discharge today. Pt was educated on care plan and verbalizes understanding. Collier Bullock RN Problem: Education: Goal: Knowledge of  General Education information/materials will improve Outcome: Adequate for Discharge Goal: Emotional status will improve Outcome: Adequate for Discharge Goal: Mental status will improve Outcome: Adequate for Discharge Goal: Verbalization of understanding the information provided will improve Outcome: Adequate for Discharge   Problem: Activity: Goal: Interest or engagement in activities will improve Outcome: Adequate for Discharge Goal: Sleeping patterns will improve Outcome: Adequate for Discharge   Problem: Safety: Goal: Periods of time without injury will increase Outcome: Adequate for Discharge   Problem: Coping: Goal: Coping ability will improve Outcome: Adequate for Discharge Goal: Will verbalize feelings Outcome: Adequate for Discharge   Problem: Health Behavior/Discharge Planning: Goal: Ability to make decisions will improve Outcome: Adequate for Discharge Goal: Compliance with therapeutic regimen will improve Outcome: Adequate for Discharge   Problem: Self-Concept: Goal: Will verbalize positive feelings about self Outcome: Adequate for Discharge Goal: Level of anxiety will decrease Outcome: Adequate for Discharge

## 2019-05-12 NOTE — Progress Notes (Signed)
Recreation Therapy Notes  Date: 05/12/2019  Time: 9:30 am   Location: Craft room   Behavioral response: N/A   Intervention Topic: Values  Discussion/Intervention: Patient did not attend group.   Clinical Observations/Feedback:  Patient did not attend group.   Elverna Caffee LRT/CTRS        Annia Gomm 05/12/2019 11:18 AM

## 2019-05-12 NOTE — Progress Notes (Signed)
D - Patient was in her room upon arrival to the unit. Patient was pleasant during assessment. Patient denies SI/HI/AVh and pain. Patient endorses anxiety and depression rating them 6/10. Patient was admitted today and was isolative to her room other than getting snack and taking a phone call.   A - Patient didn't have medication scheduled this evening. Patient given education. Patient given support and encouragement to be active in her treatment plan. Patient informed to let staff know if there are any issues or problems on the unit.   R - Patient being monitored Q 15 minutes for safety per unit protocol. Patient remains safe on the unit.  

## 2019-09-29 ENCOUNTER — Ambulatory Visit: Payer: Self-pay

## 2019-09-30 ENCOUNTER — Other Ambulatory Visit: Payer: Self-pay

## 2019-09-30 ENCOUNTER — Encounter: Payer: Self-pay | Admitting: Physician Assistant

## 2019-09-30 ENCOUNTER — Ambulatory Visit (LOCAL_COMMUNITY_HEALTH_CENTER): Payer: Medicaid Other | Admitting: Physician Assistant

## 2019-09-30 VITALS — BP 115/74 | Ht 59.0 in | Wt 108.0 lb

## 2019-09-30 DIAGNOSIS — Z30017 Encounter for initial prescription of implantable subdermal contraceptive: Secondary | ICD-10-CM | POA: Diagnosis not present

## 2019-09-30 DIAGNOSIS — Z3009 Encounter for other general counseling and advice on contraception: Secondary | ICD-10-CM

## 2019-09-30 LAB — PREGNANCY, URINE: Preg Test, Ur: NEGATIVE

## 2019-09-30 MED ORDER — ETONOGESTREL 68 MG ~~LOC~~ IMPL
68.0000 mg | DRUG_IMPLANT | Freq: Once | SUBCUTANEOUS | Status: AC
  Administered 2019-09-30: 68 mg via SUBCUTANEOUS

## 2019-09-30 NOTE — Progress Notes (Signed)
[place] problem visit  [place] Department  Subjective:  Ashley Byrd is a 18 y.o. being seen today to discuss [facility] options.  Chief Complaint  Patient presents with  . Contraception    Nexplanon    HPI  Patient into clinic to discuss [facility] options.  States that she d/c OCs about 4 months ago.  Last sex was about 4 months ago as well.  Denies any vaginal symptoms and declines STD screening today.  Would like to use Nexplanon but has questions about this and other methods first.  Does the patient have a current or past history of drug use? No   No components found for: HCV]   Health Maintenance Due  Topic Date Due  . HIV Screening  06/04/2015  . TETANUS/TDAP  06/04/2019  . INFLUENZA VACCINE  07/02/2019    Review of Systems  All other systems reviewed and are negative.   The following portions of the patient's history were reviewed and updated as appropriate: allergies, current medications, past family history, past medical history, past social history, past surgical history and problem list. Problem list updated.   See flowsheet for other program required questions.  Objective:   Vitals:   09/30/19 1105  BP: 115/74  Weight: 108 lb (49 kg)  Height: 4\' 11"  (1.499 m)    Physical Exam Vitals signs reviewed.  Constitutional:      General: She is not in acute distress.    Appearance: Normal appearance.  HENT:     Head: Normocephalic and atraumatic.  Pulmonary:     Effort: Pulmonary effort is normal.  Neurological:     Mental Status: She is alert and oriented to person, place, and time.  Psychiatric:        Mood and Affect: Mood normal.        Behavior: Behavior normal.        Thought Content: Thought content normal.        Judgment: Judgment normal.       Assessment and Plan:  Ashley Byrd is a 18 y.o. female presenting to the [facility] Department for a Women's Health problem visit  1. Encounter for  counseling regarding contraception Patient counseled re:  All hormonal and non-hormonal BCMs- risks, benefits, SE and common reasons for d/c methods. Rec condoms with all sex for STD protection. Patient opts for Nexplanon after counseling and having questions answered about the Nexplanon, IUD, Depo, and OCs.  Patient is willing to wait today for insertion. - Pregnancy, urine - HIV [place] [facility] - Syphilis Serology, [place]  2. Nexplanon insertion Nexplanon Insertion Procedure Patient identified, informed consent performed, consent signed.   Patient does understand that irregular bleeding is a very common side effect of this medication. She was advised to have backup contraception after placement. Patient was determined to meet WHO criteria for not being pregnant. Appropriate time out taken.  The insertion site was identified 8-10 cm (3-4 inches) from the medial epicondyle of the humerus and 3-5 cm (1.25-2 inches) posterior to (below) the sulcus (groove) between the biceps and triceps muscles of the patient's Right arm and marked.  Patient was prepped with alcohol swab and then injected with 3 ml of 1% lidocaine.  Arm was prepped with chlorhexidene, Nexplanon removed from packaging,  Device confirmed in needle, then inserted full length of needle and withdrawn per handbook instructions. Nexplanon was able to palpated in the patient's arm; patient palpated the insert herself.  There was minimal blood loss.  Patient insertion site covered with guaze and a pressure bandage to reduce any bruising.  The patient tolerated the procedure well and was given post procedure instructions.   Nexplanon:   Counseled patient to take OTC analgesic starting as soon as lidocaine starts to wear off and take regularly for at least 48 hr to decrease discomfort.  Specifically to take with food or milk to decrease stomach upset and for IB 600 mg (3 tablets) every 6 hrs; IB 800 mg (4 tablets) every 8 hrs; or Aleve 2 tablets  every 12 hrs.   - etonogestrel (NEXPLANON) implant 68 mg     No follow-ups on file.  No future appointments.  Matt Holmes, PA

## 2019-09-30 NOTE — Progress Notes (Signed)
Here today to discuss birth control options. Desires Nexplanon consult completed. Last PE 03/03/2019. Stopped Ocp's around 4 months ago. Hal Morales, RN

## 2019-09-30 NOTE — Progress Notes (Signed)
UPT negative today. RN counseling for Nexplanon insertion completed and consent forms signed.Ronny Bacon, RN

## 2019-12-29 IMAGING — US ULTRASOUND ABDOMEN LIMITED
1 series · 14 of 25 positions shown · non-contrast
Comparison: None.

CLINICAL DATA: Abdominal pain and vomiting.

EXAM:
ULTRASOUND ABDOMEN LIMITED RIGHT UPPER QUADRANT

[Series 1: ultrasound abdomen limited · 14 of 54 slices shown]
[im 1/54]
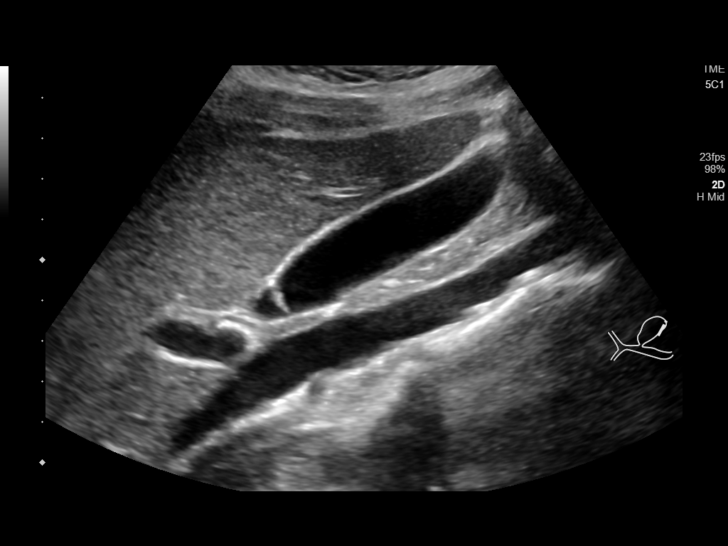
[im 5/54]
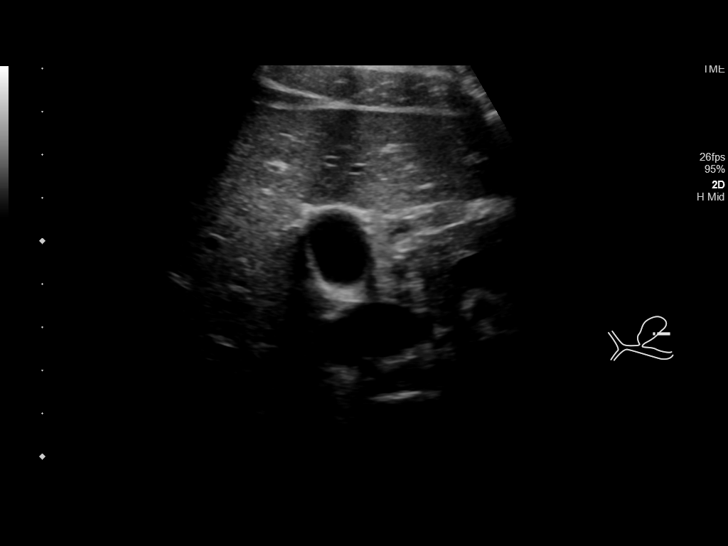
[im 9/54]
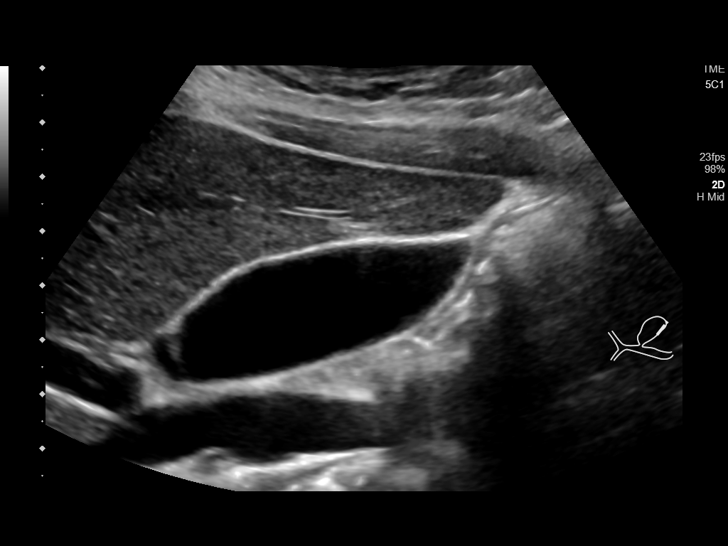
[im 14/54]
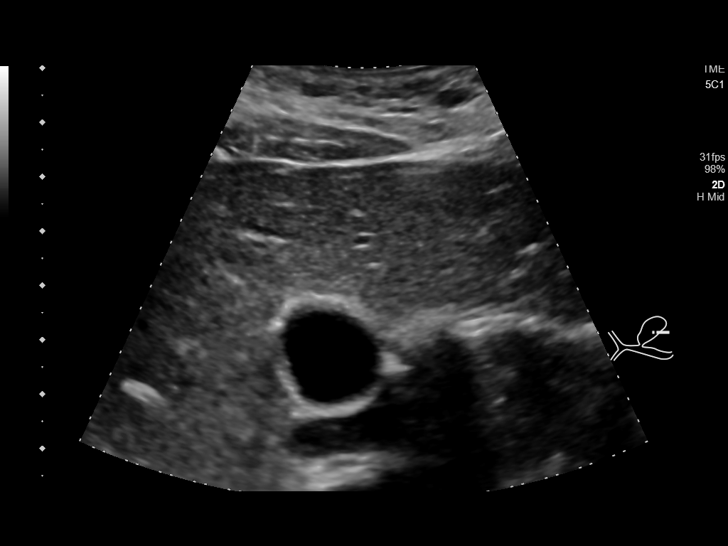
[im 18/54]
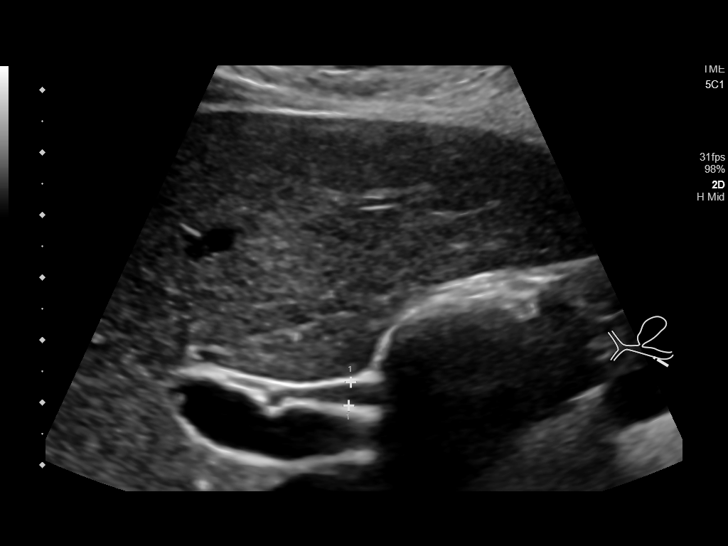
[im 20/54]
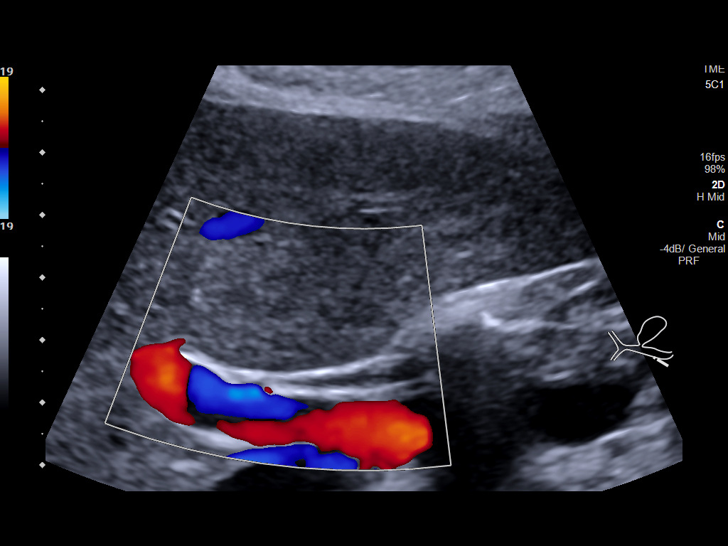
[im 25/54]
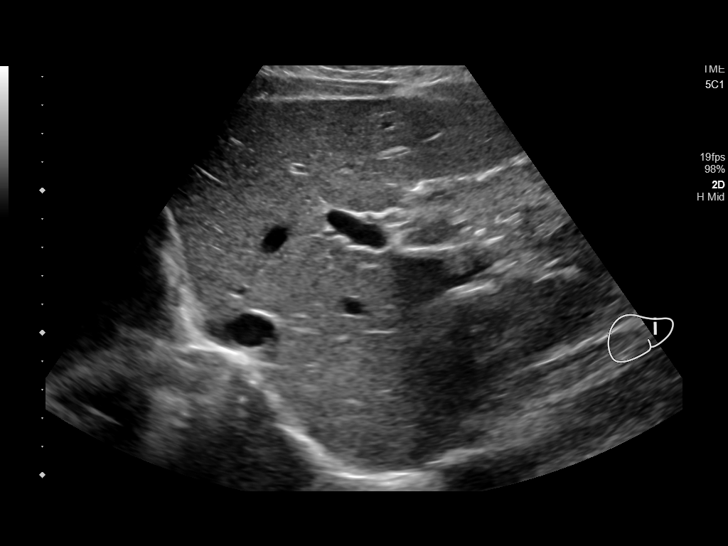
[im 29/54]
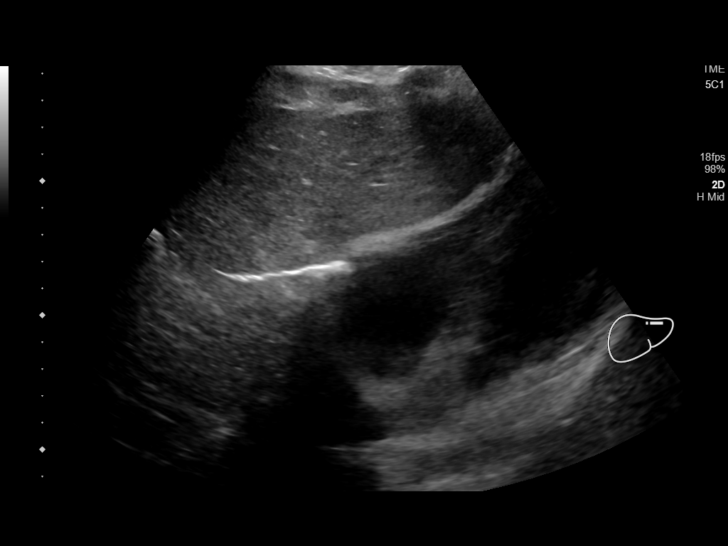
[im 34/54]
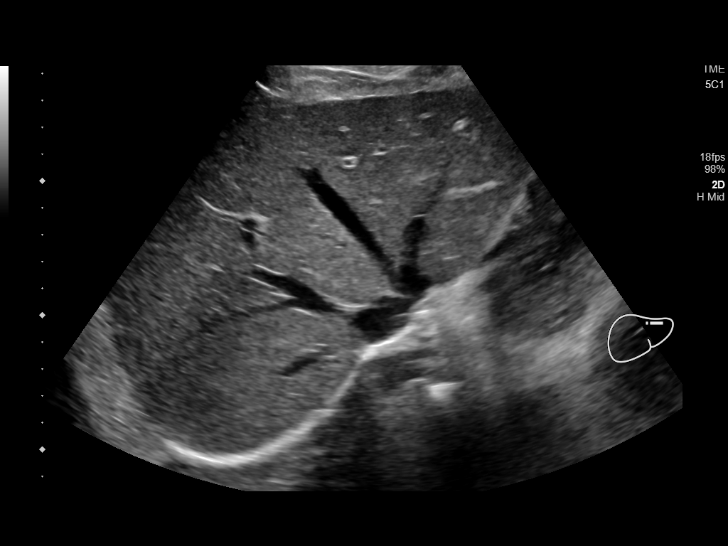
[im 36/54]
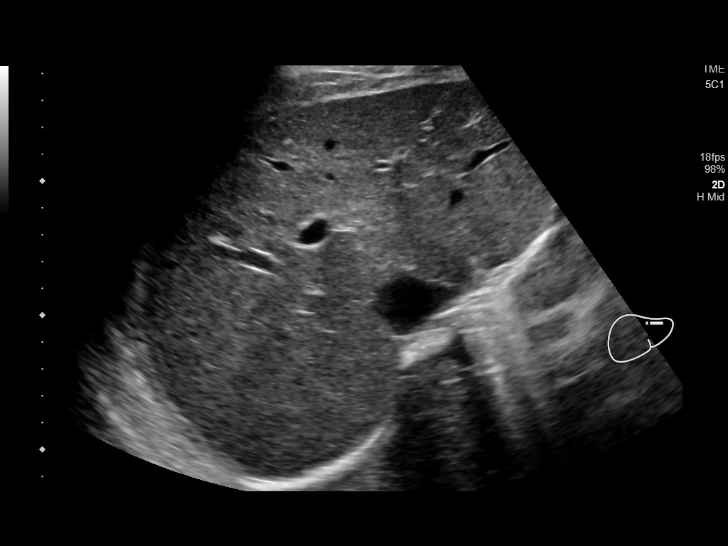
[im 40/54]
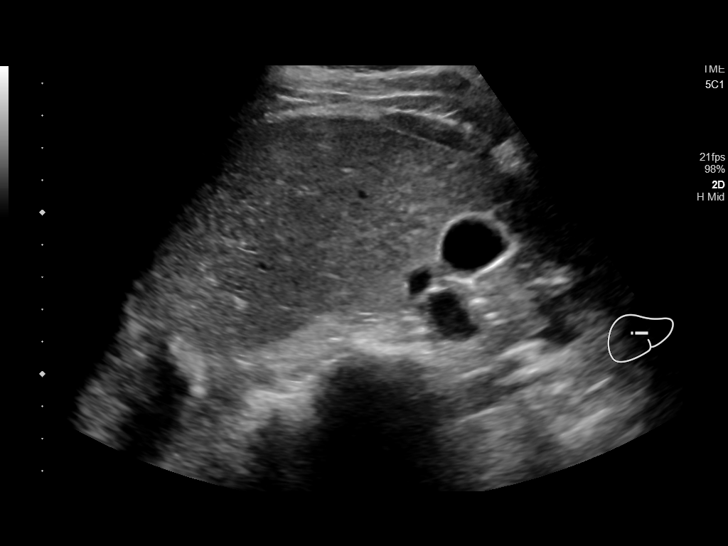
[im 45/54]
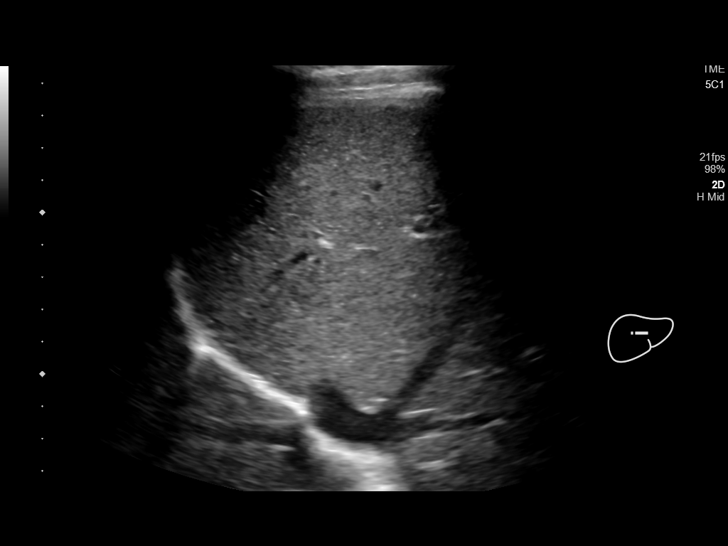
[im 49/54]
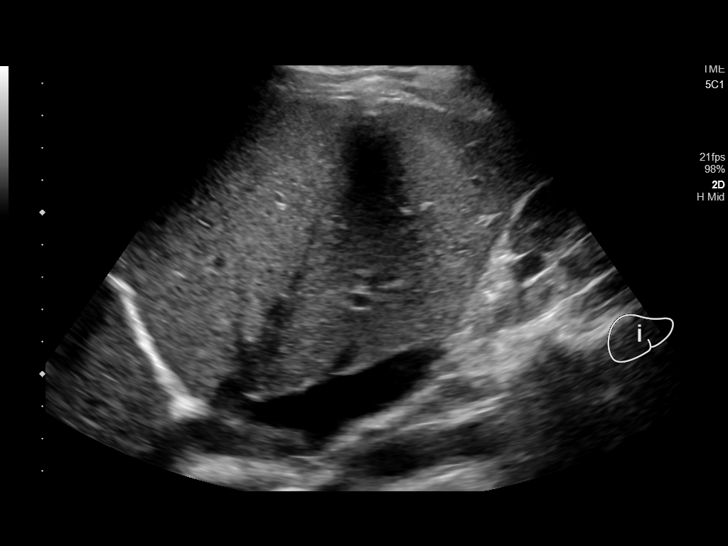
[im 54/54]
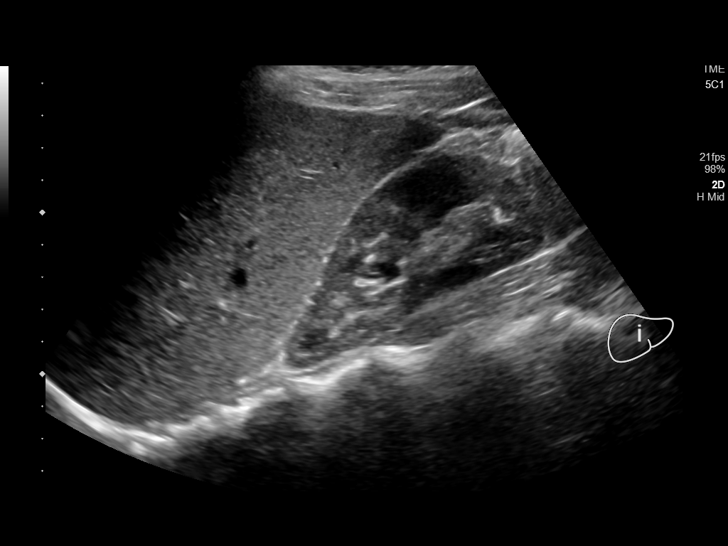

[14 of 25 positions shown; findings below may reference images not displayed]

FINDINGS: Gallbladder:

No gallstones or wall thickening visualized. No sonographic Murphy
sign noted by sonographer. Maximal wall thickness is 2.2 mm, within
normal limits

Common bile duct:

Diameter: 3.8 mm, within normal limits

Liver:

No focal lesion identified. Within normal limits in parenchymal
echogenicity. Portal vein is patent on color Doppler imaging with
normal direction of blood flow towards the liver.
IMPRESSION: Normal right upper quadrant ultrasound. No acute or focal lesion to
explain the patient's abdominal pain or vomiting.

## 2020-03-21 ENCOUNTER — Telehealth: Payer: Self-pay | Admitting: Family Medicine

## 2020-03-21 NOTE — Telephone Encounter (Signed)
Returned patient call. LM with number to call.Kimberly Nieland Brewer-Jensen, RN 

## 2020-03-21 NOTE — Telephone Encounter (Signed)
ISSUES WITH HEAVY BLEEDING WITH NEXPLANON.

## 2020-03-21 NOTE — Telephone Encounter (Signed)
Patient called back to say she has been continuous bleeding since about 10/2019, one month after nexplanon insertion. Patient states she soaks about 4 tampons per day. Occasionally bleeding stops for a couple of days and then starts up heavy again. Patient counseled to take ibuprofen 800mg , 3x/day for 5 days and if her issue is not resolved with a week or two, to call for appointment to see provider. Patient states understanding and agrees to try the ibuprofen at this time. Patient to call back if no symptom relief. , RN

## 2022-05-06 ENCOUNTER — Ambulatory Visit: Payer: Self-pay

## 2022-05-06 ENCOUNTER — Encounter: Payer: Self-pay | Admitting: Family Medicine

## 2022-05-06 ENCOUNTER — Ambulatory Visit (LOCAL_COMMUNITY_HEALTH_CENTER): Payer: Medicaid Other | Admitting: Family Medicine

## 2022-05-06 VITALS — BP 116/75 | Ht 60.0 in | Wt 142.2 lb

## 2022-05-06 DIAGNOSIS — Z01419 Encounter for gynecological examination (general) (routine) without abnormal findings: Secondary | ICD-10-CM | POA: Diagnosis not present

## 2022-05-06 DIAGNOSIS — Z30011 Encounter for initial prescription of contraceptive pills: Secondary | ICD-10-CM | POA: Diagnosis not present

## 2022-05-06 DIAGNOSIS — Z3046 Encounter for surveillance of implantable subdermal contraceptive: Secondary | ICD-10-CM | POA: Diagnosis not present

## 2022-05-06 DIAGNOSIS — Z3009 Encounter for other general counseling and advice on contraception: Secondary | ICD-10-CM

## 2022-05-06 DIAGNOSIS — Z113 Encounter for screening for infections with a predominantly sexual mode of transmission: Secondary | ICD-10-CM

## 2022-05-06 DIAGNOSIS — F419 Anxiety disorder, unspecified: Secondary | ICD-10-CM

## 2022-05-06 LAB — WET PREP FOR TRICH, YEAST, CLUE
Trichomonas Exam: NEGATIVE
Yeast Exam: NEGATIVE

## 2022-05-06 LAB — HM HIV SCREENING LAB: HM HIV Screening: NEGATIVE

## 2022-05-06 MED ORDER — NORGESTIM-ETH ESTRAD TRIPHASIC 0.18/0.215/0.25 MG-25 MCG PO TABS: 1.0000 | ORAL_TABLET | Freq: Every day | ORAL | 12 refills | Status: DC

## 2022-05-06 NOTE — Progress Notes (Signed)
[facility] [address] Main Number: [phone]    Family Planning Visit- Initial Visit  Subjective:  Ashley Byrd is a 21 y.o.  G0P0000   being seen today for an initial annual visit and to discuss reproductive life planning.  The patient is currently using Hormonal Injection for pregnancy prevention. Patient reports   does not want a pregnancy in the next year.     report they are looking for a method that provides Method that is not associated with weight gain, Does not want something inserted, and Method they can control starting/stopping  Patient has the following medical conditions has MDD (major depressive disorder), single episode, severe (HCC); Suicide attempt by drug overdose (HCC); and Abdominal pain on their problem list.  Chief Complaint  Patient presents with   Annual Exam    PE and Nexplanon removal, PAPa    Patient reports here for PE, nexplanon removal and start OCP   Patient denies any other concerns than those listed on the intake form.  See intake form.     Body mass index is 27.77 kg/m. - Patient is eligible for diabetes screening based on BMI and age 22>40?  not applicable HA1C ordered? no  Patient reports 1  partner/s in last year. Desires STI screening?  Yes  Has patient been screened once for HCV in the past?  No  No results found for: HCVAB  Does the patient have current drug use (including MJ), have a partner with drug use, and/or has been incarcerated since last result? No  If yes-- [facility]   Does the patient meet criteria for HBV testing? No  Criteria:  -Household, sexual or needle sharing contact with HBV -History of drug use -HIV positive -Those with known Hep C   Health Maintenance Due  Topic Date Due   HPV VACCINES (1 - 2-dose series) Never done   Hepatitis C Screening  Never done   PAP-Cervical Cytology Screening  Never done   PAP  SMEAR-Modifier  Never done    Review of Systems  Constitutional:  Negative for chills, fever, malaise/fatigue and weight loss.  HENT:  Negative for congestion, hearing loss and sore throat.   Eyes:  Positive for blurred vision. Negative for double vision and photophobia.  Respiratory:  Negative for shortness of breath.   Cardiovascular:  Negative for chest pain.  Gastrointestinal:  Negative for abdominal pain, blood in stool, constipation, diarrhea, heartburn, nausea and vomiting.  Genitourinary:  Negative for dysuria and frequency.  Musculoskeletal:  Negative for back pain, joint pain and neck pain.  Skin:  Negative for itching and rash.  Neurological:  Positive for headaches. Negative for dizziness and weakness.  Endo/Heme/Allergies:  Does not bruise/bleed easily.  Psychiatric/Behavioral:  Negative for depression, substance abuse and suicidal ideas.    The following portions of the patient's history were reviewed and updated as appropriate: allergies, current medications, past family history, past medical history, past social history, past surgical history and problem list. Problem list updated.   See flowsheet for other program required questions.  Objective:   Vitals:   05/06/22 1423  BP: 116/75  Weight: 142 lb 3.2 oz (64.5 kg)  Height: 5' (1.524 m)    Physical Exam Vitals and nursing note reviewed.  Constitutional:      Appearance: Normal appearance.  HENT:     Head: Normocephalic and atraumatic.     Mouth/Throat:     Mouth:  Mucous membranes are moist.     Pharynx: No oropharyngeal exudate or posterior oropharyngeal erythema.  Eyes:     General: No scleral icterus. Cardiovascular:     Rate and Rhythm: Normal rate and regular rhythm.     Pulses: Normal pulses.     Heart sounds: Normal heart sounds.  Pulmonary:     Effort: Pulmonary effort is normal.     Breath sounds: Normal breath sounds.  Abdominal:     General: Abdomen is flat. Bowel sounds are normal.      Palpations: Abdomen is soft.  Genitourinary:    General: Normal vulva.     Rectum: Normal.     Comments: External genitalia without, lice, nits, erythema, edema , lesions or inguinal adenopathy. Vagina with normal mucosa and bloody discharge and pH > 4.  Cervix without visual lesions, uterus firm, mobile, non-tender, no masses, CMT adnexal fullness or tenderness.   Musculoskeletal:        General: Normal range of motion.     Cervical back: Normal range of motion and neck supple.  Skin:    General: Skin is warm and dry.  Neurological:     General: No focal deficit present.     Mental Status: She is alert and oriented to person, place, and time.  Psychiatric:        Mood and Affect: Mood normal.        Behavior: Behavior normal.      Assessment and Plan:  Ashley Byrd is a 21 y.o. female presenting to the [facility] Department for an initial annual wellness/contraceptive visit  Contraception counseling: Reviewed options based on patient desire and reproductive life plan. Patient is interested in Oral Contraceptive. This was provided to the patient today.   Risks, benefits, and typical effectiveness rates were reviewed.  Questions were answered.  Written information was also given to the patient to review.    The patient will follow up in  1 years for surveillance.  The patient was told to call with any further questions, or with any concerns about this method of contraception.  Emphasized use of condoms 100% of the time for STI prevention.  Need for ECP was assessed. Patient reported last sex was  > 120 hours .  Reviewed options and patient desired No method of ECP, declined all    1. Smear, vaginal, as part of routine gynecological examination Well woman exam  Pap today  CBE today  - IGP, rfx Aptima HPV ASCU  2. Screening for venereal disease Patient accepted all screenings including wet prep, oral, vaginal CT/GC and bloodwork for HIV/RPR.    Wet prep results  neg    Treatment needed  Discussed time line for State [facility] results and that patient will be called with positive results and encouraged patient to call if she had not heard in 2 weeks.  Counseled to return or seek care for continued or worsening symptoms Recommended condom use with all sex  Patient is currently using  OCP  to prevent pregnancy.   - Chlamydia/Gonorrhea [facility] - HIV [facility] - WET PREP FOR TRICH, YEAST, CLUE - Syphilis Serology, [facility] - Chlamydia/Gonorrhea [facility]  3. Nexplanon removal  Patient identified, informed consent performed, consent signed.   Appropriate time out taken. Nexplanon site identified.  Area prepped in usual sterile fashon. 3 ml of 1% lidocaine with Epinephrine was used to anesthetize the area at the distal end of the implant and  along implant site. A small stab incision was made right beside the implant on the distal portion.  The Nexplanon rod was grasped using manual and removed without difficulty.  There was minimal blood loss. There were no complications.  Steri-strips were applied over the small incision.  A pressure bandage was applied to reduce any bruising.  The patient tolerated the procedure well and was given post procedure instructions.     Counseled patient to take OTC analgesic starting as soon as lidocaine starts to wear off and take regularly for at least 48 hr to decrease discomfort.  Specifically to take with food or milk to decrease stomach upset and for IB 600 mg (3 tablets) every 6 hrs; IB 800 mg (4 tablets) every 8 hrs; or Aleve 2 tablets every 12 hrs.    4. Encounter for initial prescription of contraceptive pills Pt desires OCP  - Norgestimate-Ethinyl Estradiol Triphasic 0.18/0.215/0.25 MG-25 MCG tab; Take 1 tablet by mouth daily.  Dispense: 28 tablet; Refill: 12  5. Anxiety Referral to LCSW  - Ambulatory referral to Behavioral Health     No follow-ups on file.  No future appointments.  Wendi Snipes,  FNP

## 2022-05-10 LAB — IGP, RFX APTIMA HPV ASCU: PAP Smear Comment: 0

## 2022-10-14 NOTE — Progress Notes (Signed)
10-14-2022 Received a medical record request from [facility] Department for copies of last physical, all pap smear and vaccination records, all family planning and contraception records and labs for Ashley Byrd, dob-Jan 19, 2001. Per patient signed request, medical records mailed certified mail to A. Product/process development scientist at [facility], [address] [place], [place], [place] [address]. Medical records released include the following: 05-06-2022 pap smear results and follow up notes; 05-06-22 wet mount, gc/chlamydia, HIV, RPR test results; 05-06-22 family planning annual visit notes, office visit default flowsheet; 03-21-2020 telephone note; 09-30-2019 HIV, RPR test results, 09-30-2019 family planning office visit for contraception, nexplanon consent, and urine pregnancy test results. Herby Abraham RN.

## 2022-10-17 LAB — HEPATITIS C ANTIBODY: HCV Ab: NEGATIVE

## 2022-10-17 LAB — OB RESULTS CONSOLE HIV ANTIBODY (ROUTINE TESTING): HIV: NONREACTIVE

## 2022-10-17 LAB — OB RESULTS CONSOLE VARICELLA ZOSTER ANTIBODY, IGG: Varicella: IMMUNE

## 2022-10-17 LAB — OB RESULTS CONSOLE RPR: RPR: NONREACTIVE

## 2022-10-17 LAB — OB RESULTS CONSOLE HGB/HCT, BLOOD
HCT: 35 (ref 29–41)
Hemoglobin: 11.7

## 2022-10-17 LAB — OB RESULTS CONSOLE ANTIBODY SCREEN: Antibody Screen: NEGATIVE

## 2022-10-17 LAB — OB RESULTS CONSOLE GC/CHLAMYDIA
Chlamydia: NEGATIVE
Neisseria Gonorrhea: NEGATIVE

## 2022-10-17 LAB — OB RESULTS CONSOLE RUBELLA ANTIBODY, IGM: Rubella: IMMUNE

## 2022-10-17 LAB — OB RESULTS CONSOLE ABO/RH
RH Type: NEGATIVE
RH Type: POSITIVE

## 2022-10-17 LAB — OB RESULTS CONSOLE HEPATITIS B SURFACE ANTIGEN: Hepatitis B Surface Ag: NEGATIVE

## 2022-12-01 NOTE — L&D Delivery Note (Signed)
OB/GYN Faculty Practice Delivery Note  Ashley Byrd is a 22 y.o. G1P0000 s/p SVD at [redacted]w[redacted]d. She was admitted for IOL.   ROM: 4h 18m with clear fluid GBS Status:  Positive/-- (05/09 1300) Maximum Maternal Temperature:  Temp (24hrs), Avg:98.2 F (36.8 C), Min:98.1 F (36.7 C), Max:98.2 F (36.8 C)    Labor Progress: Patient arrived at 0.5 cm dilation and was induced with Cytotec, FB, pitocin.   Delivery Date/Time: 04/12/2023 at 1322 Delivery: Called to room and patient was complete and pushing. Head delivered in ROA position. Nuchal cord present and reduced immediately. Shoulder and body delivered in usual fashion. Infant with spontaneous cry, placed on mother's abdomen, dried and stimulated. Cord clamped x 2 after 1-minute delay, and cut by FOB. Cord blood drawn. Placenta delivered spontaneously with gentle cord traction. Fundus firm with massage and Pitocin. Labia, perineum, vagina, and cervix inspected with 1st degree perineal repaired with 3-0 vicryl.   Placenta:  spontaneous, intact, 3 vessel cord  Complications: None Lacerations: 1st degree perineal  EBL: 152 mL Analgesia: epidural    Infant: APGAR (1 MIN): 7   APGAR (5 MINS): 9   APGAR (10 MINS):    Weight: pending  Derrel Nip, MD  OB Fellow  04/12/2023 2:09 PM

## 2023-01-19 ENCOUNTER — Telehealth: Payer: Self-pay

## 2023-01-19 NOTE — Telephone Encounter (Signed)
Spoke w/ pt to see if obgyn records have been received. Pt stated she will stop by the [facility] and see if she can pick them up

## 2023-01-20 ENCOUNTER — Telehealth: Payer: Self-pay

## 2023-01-20 NOTE — Telephone Encounter (Signed)
Called pt to check on records. Pt stated she will try to have them by the 22nd.

## 2023-01-29 ENCOUNTER — Encounter: Payer: Medicaid Other | Admitting: Obstetrics and Gynecology

## 2023-02-25 ENCOUNTER — Ambulatory Visit (INDEPENDENT_AMBULATORY_CARE_PROVIDER_SITE_OTHER): Payer: Medicaid Other | Admitting: Family Medicine

## 2023-02-25 ENCOUNTER — Encounter: Payer: Self-pay | Admitting: Family Medicine

## 2023-02-25 VITALS — BP 131/84 | HR 93 | Wt 179.8 lb

## 2023-02-25 DIAGNOSIS — O99343 Other mental disorders complicating pregnancy, third trimester: Secondary | ICD-10-CM | POA: Diagnosis not present

## 2023-02-25 DIAGNOSIS — Z148 Genetic carrier of other disease: Secondary | ICD-10-CM

## 2023-02-25 DIAGNOSIS — Z3A29 29 weeks gestation of pregnancy: Secondary | ICD-10-CM | POA: Diagnosis not present

## 2023-02-25 DIAGNOSIS — Z3490 Encounter for supervision of normal pregnancy, unspecified, unspecified trimester: Secondary | ICD-10-CM

## 2023-02-25 NOTE — Progress Notes (Signed)
CC: denies any concerns  

## 2023-02-25 NOTE — Progress Notes (Signed)
    PRENATAL VISIT NOTE  Subjective:  Ashley Byrd is a 22 y.o. G1P0000 at [redacted]w[redacted]d being seen today for transferring prenatal care from [facility]. Dating is by unsure LMP, and u/s which measured 2 weeks ahead, needs f/u.  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  Patient reports no complaints.  Contractions: Not present. Vag. Bleeding: None.  Movement: Present. Denies leaking of fluid.   The following portions of the patient's history were reviewed and updated as appropriate: allergies, current medications, past family history, past medical history, past social history, past surgical history and problem list.   Objective:   Vitals:   02/25/23 1011  BP: 131/84  Pulse: 93  Weight: 179 lb 12.8 oz (81.6 kg)    Fetal Status: Fetal Heart Rate (bpm): 145 Fundal Height: 31 cm Movement: Present     General:  Alert, oriented and cooperative. Patient is in no acute distress.  Skin: Skin is warm and dry. No rash noted.   Cardiovascular: Normal heart rate noted  Respiratory: Normal respiratory effort, no problems with respiration noted  Abdomen: Soft, gravid, appropriate for gestational age.  Pain/Pressure: Absent     Pelvic: Cervical exam deferred        Extremities: Normal range of motion.  Edema: None  Mental Status: Normal mood and affect. Normal behavior. Normal judgment and thought content.   Assessment and Plan:  Pregnancy: G1P0000 at [redacted]w[redacted]d 1. Encounter for supervision of normal pregnancy, antepartum, unspecified gravidity Dating is by unsure LMP, and u/s which measured 2 weeks ahead, needs f/u. Needs 2 hour - [place] MFM OB DETAIL +14 WK; Future  2. 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 - [place] MFM OB DETAIL +14 WK; Future  Preterm labor symptoms and general obstetric precautions including but not limited to vaginal bleeding, contractions, leaking of fluid and fetal movement were reviewed in detail with the patient. Please refer to After Visit Summary for other counseling recommendations.   Return in 2 weeks (on 03/11/2023) for 28 wk labs, [facility].  Future Appointments  Date Time Provider [place]  03/03/2023  8:15 AM [facility] NURSE [facility]  03/03/2023  8:30 AM [facility] US7 [facility]  03/12/2023  8:15 AM [facility] LAB [facility]  03/12/2023  8:35 AM Aletha Halim, MD [facility]  03/26/2023 11:15 AM Anyanwu, Sallyanne Havers, MD [facility]  04/09/2023 11:15 AM Darlina Rumpf, CNM [facility]    Donnamae Jude, MD

## 2023-02-25 NOTE — Patient Instructions (Addendum)
[email or web address] Pediatric Providers  [facility] [phone]) [facility] Owens Shark, MD; Erin Hearing, MD; Gwendlyn Deutscher, MD; Andria Frames, MD; McDiarmid, MD; Dutch Quint, [place]., [place], [place] [address] [phone] Mon-Fri 8:30-12:30, 1:30-5:00  Providers come to see babies during newborn hospitalization Only accepting infants of Mother's who are seen at [facility] or have siblings seen at   [place] Medicaid - Yes; Bethania, MD [address] [place], [place] [address] [phone] Mon, Tue, Thur, Fri 8:30-5:00, Wed 10:00-7:00 (closed 1-2pm daily for lunch) [facility] residents with no insurance.  [place] only with Medicaid/insurance; Tricare - no  [facility]) - Tim and [facility], MD; Owens Shark, MD; Tamera Punt, MD; Doneen Poisson, MD; Fatima Sanger, MD; Lindwood Qua, MD; Thornell Sartorius, MD; Ronnald Ramp,  MD; Wynetta Emery, MD; Jess Barters, MD; Tami Ribas, MD; Derrell Lolling, MD; Dorothyann Peng, MD; Lucious Groves, NP [address] [place], [place], [place] [address] [phone] Mon, Tue, Thur, Fri 8:30-5:30, Wed 9:30-5:30, Sat 8:30-12:30 Only accepting infants of first-time parents or siblings of current patients Hospital discharge coordinator will make follow-up appointment Medicaid - yes; Tricare - yes  East/Northeast [place] [phone]) [facility] Garnette Czech, MD; Rosana Hoes, MD; Servando Salina, MD; Rose Fillers, MD; Corinna Capra, MD; [facility], MD; Javier Glazier, MD; Janann Colonel, MD; Jimmye [place], [place], [place], [place] [address] [phone] Mon-Fri 8:30-5:00, closed for lunch 12:30-1:30; Sat-Sun 10:00-1:00 Accepting Newborns with commercial insurance only, must call prior to delivery to be accepted into  practice.  Medicaid - no, Tricare - yes   Bradford Sullivan, [place] [address] [phone] or [phone] Mon to Fri 8am to  10pm, Sat 8am to 1pm (virtual only on weekends) Only accepts Medicaid Healthy Blue pts  [facility] ([facility]) - Pediatrics at Rogelia Boga, MD; Vilma Prader, MD; Vanita Panda, MD; Roxanne Mins, NP; Nestor Lewandowsky, MD; Ronney Lion, MD [address] [place], [place] [address] [phone] Mon-Fri 8:30-5:30 Medicaid - yes, Tricare - yes  [place] [phone]) [facility] Burn[facility]ette Pop, MD [address] [place] [address], [place], [place] [address] [phone] Sammuel Cooper, Wed Fri 8:30-5:00, Sat 8:30-12:00, Closed Thursdays Accepting siblings of established patients and first time mom's if you call prenatally Medicaid- yes; Tricare - yes  [place] at Arlina Robes, [place]; Mannie Stabile, MD; Jerald Kief; [place], [place]; Nancy Fetter, MD; Moreen Fowler, MD;  [address] [place], [place] [address] [phone] Mon-Fri 8:30-5:00, closed for lunch 1-2 Only accepting newborns of established patients Medicaid- no; Tricare - yes  [facility] [phone]) [place] at Leane Para, MD; [place], [place], [place] [address] [phone] Mon-Fri 8:00-5:00 Medicaid - No; Tricare - Yes  [place] at [facility], [place]; [place], [place], [place], [place] [address] [phone] Mon-Fri 8:00-5:00 Medicaid - No, Tricare - Yes  [facility] Abner Greenspan, MD; Sheran Lawless, MD; [place], [place] [address] [place] [address] [place], [place] [address] [phone]  Mon-Fri 8:00-5:00 Medicaid - No; Tricare - Yes  [address]., [place], [place] [address] [phone] Mon-Fri 8:30-5:00 (lunch 12:00-1:00) Medicaid -Yes; [place] at [place]field Martinique, MD [place], [place], [place] [address] [phone] Mon-Fri 8:00-5:00 Seeing newborns of current patients only. No new patients Medicaid - No, Tricare - yes  Therapist, music at [place], [address] [place], [place] [address] [phone] Mon-Fri 8:00-5:00 Medicaid -yes as secondary  coverage only; Tricare - yes  [place], [place]; [place], [place]; Albertina Parr, MD; Frederic Jericho, MD; Ronney Lion, MD; [place], [place]; [place], NP; Corinna Lines, MD; [place], [place]., [place], [place] [address] [phone] Mon-Fri 8:30-5:00, Sat 9:00-11:00 Accepts commercial insurance ONLY. Offers free prenatal information sessions for families. Medicaid - No, Tricare - Call first  [place], MD; [place], [place]; [place], [place]; [place], [place]., [place] [address] [phone] Mon-Fri 7:30-5:30 Medicaid - Yes; Marchelle Gearing yes  [place] [phone] & [phone])  [facility] [place], [place] [address]., [place], [place] [address] [phone] Mon-Thur 8:00-6:00, closed for lunch 12-2, closed Fridays Medicaid - yes; Tricare - no  [place], NP; Melford Aase, MD; L[place] Verdes [place], [place]; [place], [place] [address]., [place], [place], [place] [address] [phone] Mon-Fri 7:30-4:30 Medicaid - yes, Tricare - yes  [facility]  Carolynn Sayers, MD; Cristino Martes, NP; Gertie Baron, MD; Debara Pickett, NP [address], [place], [place] [address] [phone] Mon-Fri 8:30-5:00, closed for lunch 1-2, Sat 8:30-12:00 - sick visits only Providers come to see babies at [facility] Only accepting newborns of siblings and first time parents ONLY if who have met with office prior to delivery Medicaid -Yes; Tricare - yes  [place], [place]; Fredderick Severance, NP; Juleen China, MD; Clydene Laming, MD:  [place] [address], [place], [place] [address] [phone] Mon- Fri 8:00-5:00, Sat 9:00-12:00 - sick visits only Accepting siblings of established patients and first time mom/baby Medicaid - Yes; Tricare - yes Patients must have vaccinations (baby vaccines)  [place]/Southwest  [place] [phone] & [phone])  [place] at [place]., [place], [place] [address] [phone] Mon-Fri 8:00-5:00 Medicaid - no; Tricare - yes  [place], MD; Nix[place], [place]; Domino, [place] [address], [place], [place] [address] [phone] Mon-Fri 8:00-5:00 Medicaid- yes; Tricare - yes  [place], MD; Ronnald Ramp, NP; [place], [place] [address] [place], [place], [place] [address] [phone] Mon-Fri 8:00-5:00 Medicaid - Yes; Tricare - yes  [address] [place] [phone])  [place], [place]; [place], [place]; Maisie Fus, MD; Charlesetta Garibaldi, MD; [place], NP; Isenhour, DO; [place], [place]; Jeannine Kitten, MD; Sheila Oats, MD; Hardin Negus, MD; [place], [place]; [place], [place]; [place], [place] [address] [place], [place], [place] [address] [phone] Mon-Fri 8:30-5:00, Sat 9:00-12:00 - sick only Please register online [email or web address] then schedule online or call office Medicaid-Yes; Tricare -yes  [facility] - Premier  Dabrusco, MD; Delora Fuel, MD; [place], MD; [place], NP; [place], [place]; Everette Rank, MD; Radford Pax, NP; Melina Modena, MD [address] North [place], [place], [place] [address] [phone] Mon-Fri 8:00-5:30, Sat&Sun by appointment (phones open at 8:30) Medicaid - Yes; Tricare - yes  [place] [phone] & [phone]) [place], CPNP; Pat[place], MD; Araceli Bouche, MD; Jerelene Redden, NP; Coxton, DO [address], [place],  [address] [phone] M-F 8:00 - 5:15, Sat/Sun 9-12 sick visits only Medicaid - No; Tricare - yes  [place], PA-C; [place], PA-C; Greeley, DO; Nezper[place], PA-C; [place], PA-C; Vassie Moselle, [place]., [place],  [address] [phone] Mon-Thur 8:00-7:00, Fri 8:00-5:00 Accepting Medicaid for 13 and under only   [facility] - Family Medicine at [place] [facility]) [place], [place]; List, FNP; Selinda Eon, MD; [place],  PA-C; Hubbard Hartshorn, MD; Modena Nunnery, FNP; Everlean Patterson, FNP; Tempie Donning, MD; Selinda Eon, Roxie [address], [place], [place] [address] [phone] Mon-Fri 8:30-5:30 Medicaid - Yes; Tricare - yes  [place], [place]; Rolla Plate, MD; Carola Rhine, MD; Tyron Russell, MD; [place]equ[place], NP [address], [place], [place] [address] [phone] Mon-Thur 8:00-5:30, Fri 8:00-5:00, Sat 9:00-12:00 Medicaid - yes, Tricare - yes  [place] [phone])  [place] at [facility], [place]; Olen Pel, MD; [place], [place], [place], [place] [address] [phone] Mon-Fri 8:00-5:00, closed for lunch 12-1 Medicaid - No; [place] - yes  Therapist, music at [facility], Center [place], [place], [place] [address] [phone] Mon-Fri 8:00-5:00 Medicaid - No; Tricare - yes  [facility] - [facility], MD; Joelene Millin, MD; Teryl Lucy, MD; Ronnald Ramp, MD [address], [place], [place] [address] [phone] Mon-Fri 8:00-5:00 Medicaid- Yes; Tricare - yes  [place] [phone])  [place] at [facility], [place]; Cumby, MD [address] [place] [address] [place], [place], [place] [address] [phone] Mon-Fri 8:00-5:00 Medicaid - No; [place] - yes  [place] [address] [place], [place], [place] [address] [phone] Mon-Weds 8:00-6:00, Thurs-Fri 8:00-5:00, Sat 9:00-12:00 Medicaid - yes; Tricare - yes   [place], MD; [place], [place] [address] [place], [place] [address] [phone] Mon-Fri 8:00-5:00 Medicaid - yes; [place] - yes  [facility] Pediatric Providers  [facility] [address] [place], [place] [address] [phone] Gentry Roch: 8am -8pm, Tues, Weds: 8am - 5pm; Fri:  8-1 Medicaid - Yes; Tricare - yes  [facility] Ilda Mori, MD; Wynetta Emery, MD; Rock Nephew, MD; [place], [place]; [place], [place] [address], [place], [place] [address] [phone] M-F 8:30 - 5:00 Medicaid - Call office; Tricare -yes  [facility] Fredderick Erb, MD; Wynelle Cleveland, MD, Cherylann Banas, MD; Koren Bound, PNP; Terrial Rhodes, [place] [address] [place], [place] [address] [phone] M-F 8:30 - 5:00, Sat/Sun 8:30 - 12:30 (sick visits) Medicaid - Call office; Tricare -yes  [facility] Bobby Rumpf, MD; Wynetta Emery, PNP; Jaynie Crumble, MD; [place], [place]; [place], NP; Orson Aloe [address] [place], [place], [place] [address] [phone] M-F 8:30 - 5:00 Medicaid - Call office; Tricare - yes  [facility] Collene Leyden, MD; Marney Doctor, MD; Melburn Hake, MD; Franki Cabot, MD; Nogo, MD [phone] S[address] [place], [place] [address] [phone] M-Thur: 8:00 - 5:00; Fri: 8:00 - 4:00 Medicaid - yes; Tricare - yes  [facility] [address] [place], [place] [address] [phone] M-F: 8:30- 5:00, closed for lunch 12:30 - 1:00 Medicaid - yes; Tricare -yes  [place] [address], [place], [place] [id number] [phone] M-F 8:00 - 5:00 Medicaid - yes; Tricare - yes  H[place], DO; [place], DO; [place], [place] [address], [place], [place] [address] [phone] M-F 8:00 - 5:00, Closed 12-1 for lunch Medicaid - Call; Tricare - yes  [place], MD [address] [place],  [address] [phone] M-F: 8:00-5:00, Sat: 8:00 - noon Medicaid - call; [place] -yes  [facility], FNP-C [address] [place], [place],  [address] [phone] M-W: 8:00-5:00, Thur: 8:00 - 7:00, Fri: 8:00 - noon Medicaid - yes; Tricare - yes  [place], [place] Main  [address] [place], [place] [address] [phone] M-F 8:00 - 5:00,  Closed for lunch 12-1 Medicaid - yes; [place] - yes  [facility] Pediatric Providers  [facility] at [place], [place], Trilby Drummer, MD, Stokes, [place], [facility], [place], [place], [place] [address] [phone] M-T 8:00-5:00, Wed-Fri 7:00-6:00 Medicaid - Yes; Tricare -yes  [place] at [facility], [place]; [address] [place], [place], [place] [address] [phone] M-F 8:00 - 5:00, closed for lunch 12-1 Medicaid - Yes; Tricare - yes  [facility]  Drema Dallas, MD; Marilynn Rail, MD; Kerby Less, MD; Margaretmary Eddy, MD; Damita Dunnings, MD; Deidre Ala, MD; Arrie Eastern, MD, Leward Quan, MD; Sherren Mocha, MD; Emmit Pomfret, MD; Morton Stall, MD; Clydene Laming, MD [address] [place], [place] [address] [phone] M-F 8:00-5:00 Medicaid - yes; Tricare - yes  [facility] [place], MD (speaks [place] and [place]) [address] [place], [place] [address] [phone] M-F: 8:30 - 5:00, closed 12:30 - 1 for lunch Medicaid - Yes; [place] -yes  [facility] Pediatric Providers  [facility] Delice Lesch, MD; Lindell Noe, MD; Lavone Neri, [place], [place], [place] [address] [phone] M-Th: 8:00 - 5:30, Fri: 8:00 - 12:00 Medicaid - yes; Tricare - yes  [place] [facility] at [facility], NP; Verlee Monte, MD; Nadeen Landau, MD [place] [address] [place], [place] [address] [phone] M-F: 8:00 - 5:00 Medicaid - yes; Tricare - yes  Rico, NP; Deon Pilling, NP; Angelica Pou, NP; Manya Silvas, MD; Jimmye Norman, MD, [place], NP, Glo Herring, MD; Cathleen Fears [address], [place], [place] [address] [phone] M-F: 8:30 - 5:30p Medicaid - yes; Tricare - yes Other locations available as well  [facility], MD; Redmond Pulling, MD; Marcine Matar, [place], [place], [place] [address] [phone] M-W: 8:00am - 7:00pm, Thurs:  8:00am - 8:00pm; Fri: 8:00am - 5:00pm, closed daily from 12-1 for lunch Medicaid - yes; [place] - yes  [facility] Pediatric Providers  [place] at Robb Matar, MD; Donella Stade, [place]; Carrolyn Meiers, MD; Johnnette Litter, MD; [place], [place]; Leonie Man; [place], [place]; Alcario Drought, MD;  [address], [place], [place] [address] [phone] Jerilynn Mages - Ludwig Clarks: [place], Sat 9-noon Medicaid - Yes; Tricare -yes  [facility] at Smitty Knudsen, MD; Ronnald Ramp, FNP; Sherryle Lis, MD; Teryl Lucy, [place]. Sharyl Nimrod, [phone] M-F 8:00 - 5:00 Medicaid - call; Tricare - yes  [facility] Lollie Sails, MD; [place], [place]; Lawana Chambers, MD; Ouida Sills, MD; Red Christians, MD; Marzetta Board, MD; Emiliano Dyer; Randolm Idol, MD; Derald Macleod, MD; [place], [place], [place], [place] [address] [phone] M-F 8:00am - 5:00pm; Sat. 9:00 - 11:00 Medicaid - yes; Tricare - yes  [place] [facility] at [facility], MD [address], [place], [place] [address] [phone] M-F 8:00 - 5:00 Medicaid - [place] Medicaid only; Tricare - yes  [facility]s - Signa Kell, MD; Rosana Hoes, [place]; Gordan Payment, [place], [place], [place] [address] [phone] M-F 8:00 - 5:00 Medicaid - yes; Tricare - yes  [facility] [address] Pediatrics - Francine Graven, MD; Owens Shark, MD, [facility], MD, [place], MD; St. [place], MD; Tamala Julian, MD; [address] [place],  [address] [phone] M-F: 8-5 Medicaid - yes; [place] - yes  [place], [place]; [place], MD; [address], [place],  [address] [phone] M-F 8-5 Medicaid - yes; Tricare - yes  [address] [place] Joycelyn Rua, MD;  Joelene Millin, MD; [place]-Court[place], MD; P[place], DNP; West [place], [place], [address], [place], [place] [address] [phone] M-F 8-5 Medicaid - yes; Tricare - yes  [facility]  [facility] Danelle Earthly, MD; Vinson Moselle; Lucie Leather, MD [address], [place], [place] [id number] [phone] M-F 7am - 5 pm Medicaid - call; Tricare - yes  [place], [place]; Louanne Skye, MD; Quentin Cornwall, [place], [place], [place] [address] [phone] M-F 8-5 Medicaid - yes; Tricare - yes  [facility] Pascal Lux, MD; Suzan Slick, MD; Jimmye [place], [place]; Rolena Infante, [place]; [place] Papas, [place]; Robinette Haines; [facility] - FNP [address] [place], [place] [address] [phone] M-F 8-5 Medicaid- yes; Tricare - yes  [facility] - [place] Sellers, Lively and Willa Frater, MD; Dianna Rossetti, MD; Frances Maywood, MD; Nicole Kindred, MD; [place], [place]; Marijean Bravo, MD; Louanne Skye, MD; Benjamine Mola, MD [address], [place], [place] [address] [phone] M-F: 8-5, Sat: 9-4, Sun 9-12 Medicaid - yes; Tricare - yes  [place], PNP; Rosana Hoes, [place] [address] Ny[place] Lint [place], [place] [address] [phone] M-F 8 - 5, closed 12-1 for lunch Medicaid - yes; Tricare - yes  Helena Flats, MD; Enid Derry, MD; Benjamine Mola, MD; [place], [place], [place], [place] [address] [phone] M-F 8- 5:30 Medicaid - yes; Tricare - yes  [place], MD; Jeanell Sparrow, MD; Bartholome Bill, [place], [place], [place] [address] [phone] Jerilynn Mages: Sharyne Richters; Tues-Fri: 8-5; Sat: 9-12 Medicaid - yes; Tricare - yes  Signa Kell [facility] Pediatrics - Myrene Buddy, MD; Bjorn Loser, MD; Carlis Abbott, MD; Claudean Kinds, MD; Erik Obey, MD [address] [place], [place] [address] [phone] Jerilynn MagesMarland Kitchen Sharyne RichtersDarrin Luis: 8-5; Sat: 8:30-12:30 Medicaid - yes; Tricare - yes  Dareen Piano [place], MD; Mount [place], [place], [place], [place]  [address] [phone] Mon-Fri: 8-5 Medicaid - yes; Tricare - yes  [facility] - [place] Beasley, CPNP; [place], [place]; Benjamine Mola, MD; Donivan Scull, MD; Noel Journey, MD; [address], [place], [place] [address] [phone] M-F: 8-5, closed 1-2 for lunch Medicaid - yes; Tricare - yes  [place], [place]; [place], [place]; Tamala Julian, MD; [place], CPNP; [place], [place]; [place], MD; Juleen China, MD [address] [place], [place], [place] [address] [phone] M-Thurs: Sharyne Richters; Fri: 8-6; Sat: 9-12; Sun 2-4 Medicaid - yes; Tricare - yes  Signa Kell [facility], MD; Vernard Gambles, MD; Carol Ada, FNP; Marjorie Smolder, DO; [address] [employer], [place] [address] [phone] M-F: 8-5 Medicaid - yes; [place] - yes  [facility] Pediatric Providers  Hume, MD; Pettit, [place], [place], [place] [address] [phone] M - Fri: 8am - 5pm, closed for lunch 12-1 Medicaid - Yes; Tricare - yes  [facility] [place], MD; Ovid Curd, MD; Sanger, DO; Vinocur, MD;Hall, PA; Volanda Napoleon, [place]; Megan Salon, NP [phone] [address], [place], [place] [address] [phone] M-F 8:00 - 5:00, Sat 8:00 - 11:30 Medicaid - yes; Tricare - yes  [facility] Humphrey Rolls, MD; Leighton, MD, [address] MD, Sun[place], MD, [place], MD; Clinton, NP; [place], [place];  [address] [place],  [address] [phone] M-F 8:10am - 5:00pm Medicaid - yes; Tricare -  yes  [place], MD; [place], NP [address], [place], [place] [address] [phone] M-F 8:00 - 5:00 Medicaid - [place] Medicaid only; Tricare - yes  [place], [place]; [place], NP [place], [place], [place] [address] [phone] M-F 8:00 -  5:00; Closed for lunch 12 - 1:00 Medicaid - yes; Tricare - yes  [place], MD; Darius Bump, [place], [place], [place] [address] [phone] Mon 9-5; Tues/Wed 10-5; Thurs 8:30-5; Fri: 8-12:30 Medicaid - yes; Tricare - yes  [facility] Pediatric Providers  [facility]  [place], MD; [place], [place] [address], [place], [place] [address] [phone] phone [phone] fax M-F 7:15 - 4:30 Medicaid - yes; Tricare - yes  [facility] Anastasio Champion, MD; Willoughby, DO [address] [place], [place] [address] [phone] M-Fri: 8:30 - 5:00, closed for lunch everyday noon - 1pm Medicaid - Yes; Tricare - yes  [facility] Burdine, MD; Quillian Quince, MD; Nadara Mustard, MD; Quintin Alto, MD; Tull, [place]; Antony Blackbird, [place]; [place], [place]; [place], [place]; [place], [place]et [address] [place], [place] [address] [phone] M-Thurs: 7:30am - 7:00pm; Friday 7:30am - 4pm; Sat: 8:00 - 1:00 Medicaid - Yes; Tricare - yes  [place] [facility] Freida Busman, MD; Lanny Cramp, MD; Janit Bern, MD; Abney Crossroads, [place]. [address], [place], [place], [place] [address] [phone] M-Thur: 8:00 - 5:00, Fri: 8:00 - Noon Medicaid - yes; Tricare - yes No [place] Dettinger, MD; Lajuana Ripple, DO; [place], NP; Hassell Done, NP; Lilia Pro, NP; Jeneen Montgomery, NP; Thayer Ohm, NP; Livia Snellen, MD; [place], Gage [address], [place], [place] [address] [phone] M-F 8:00 - 5:00 Medicaid - yes; Tricare - yes  [place] Collins, FNP-C; [place], FNP-C [address] Mindi Slicker, [place] [address] [phone] M, W, R 8:00-5:00, Tues: 8:00am - 7:00pm; Fri 8:00 - noon Medicaid - Yes; Tricare - yes  [facility], MD [place], [place] [address] [phone]  M-Thurs 8:30-5:30, Fri: 8:30-12:30pm Medicaid - Yes; Tricare - N  Third Trimester of  Pregnancy  The third trimester of pregnancy is from week 28 through week 40. This is months 7 through 9. The third trimester is a time when the unborn baby (fetus) is growing rapidly. At the end of the ninth month, the fetus is about 20 inches long and weighs 6-10 pounds. Body changes during your third trimester During the third trimester, your body will continue to go through many changes. The changes vary and generally return to normal after your baby is born. Physical changes Your weight will continue to increase. You can expect to gain 25-35 pounds (11-16 kg) by the end of the pregnancy if you begin pregnancy at a normal weight. If you are underweight, you can expect to gain 28-40 lb (about 13-18 kg), and if you are overweight, you can expect to gain 15-25 lb (about 7-11 kg). You may begin to get stretch marks on your hips, abdomen, and breasts. Your breasts will continue to grow and may hurt. A yellow fluid (colostrum) may leak from your breasts. This is the first milk you are producing for your baby. You may have changes in your hair. These can include thickening of your hair, rapid growth, and changes in texture. Some people also have hair loss during or after pregnancy, or hair  that feels dry or thin. Your belly button may stick out. You may notice more swelling in your hands, face, or ankles. Health changes You may have heartburn. You may have constipation. You may develop hemorrhoids. You may develop swollen, bulging veins (varicose veins) in your legs. You may have increased body aches in the pelvis, back, or thighs. This is due to weight gain and increased hormones that are relaxing your joints. You may have increased tingling or numbness in your hands, arms, and legs. The skin on your abdomen may also feel numb. You may feel short of breath because of your expanding uterus. Other changes You may urinate more often because the fetus is moving lower into your pelvis and pressing on  your bladder. You may have more problems sleeping. This may be caused by the size of your abdomen, an increased need to urinate, and an increase in your body's metabolism. You may notice the fetus "dropping," or moving lower in your abdomen (lightening). You may have increased vaginal discharge. You may notice that you have pain around your pelvic bone as your uterus distends. Follow these instructions at home: Medicines Follow your health care provider's instructions regarding medicine use. Specific medicines may be either safe or unsafe to take during pregnancy. Do not take any medicines unless approved by your health care provider. Take a prenatal vitamin that contains at least 600 micrograms (mcg) of folic acid. Eating and drinking Eat a healthy diet that includes fresh fruits and vegetables, whole grains, good sources of protein such as meat, eggs, or tofu, and low-fat dairy products. Avoid raw meat and unpasteurized juice, milk, and cheese. These carry germs that can harm you and your baby. Eat 4 or 5 small meals rather than 3 large meals a day. You may need to take these actions to prevent or treat constipation: Drink enough fluid to keep your urine pale yellow. Eat foods that are high in fiber, such as beans, whole grains, and fresh fruits and vegetables. Limit foods that are high in fat and processed sugars, such as fried or sweet foods. Activity Exercise only as directed by your health care provider. Most people can continue their usual exercise routine during pregnancy. Try to exercise for 30 minutes at least 5 days a week. Stop exercising if you experience contractions in the uterus. Stop exercising if you develop pain or cramping in the lower abdomen or lower back. Avoid heavy lifting. Do not exercise if it is very hot or humid or if you are at a high altitude. If you choose to, you may continue to have sex unless your health care provider tells you not to. Relieving pain and  discomfort Take frequent breaks and rest with your legs raised (elevated) if you have leg cramps or low back pain. Take warm sitz baths to soothe any pain or discomfort caused by hemorrhoids. Use hemorrhoid cream if your health care provider approves. Wear a supportive bra to prevent discomfort from breast tenderness. If you develop varicose veins: Wear support hose as told by your health care provider. Elevate your feet for 15 minutes, 3-4 times a day. Limit salt in your diet. Safety Talk to your health care provider before traveling far distances. Do not use hot tubs, steam rooms, or saunas. Wear your seat belt at all times when driving or riding in a car. Talk with your health care provider if someone is verbally or physically abusive to you. Preparing for birth To prepare for the arrival of your baby:  Take prenatal classes to understand, practice, and ask questions about labor and delivery. Visit the hospital and tour the maternity area. Purchase a rear-facing car seat and make sure you know how to install it in your car. Prepare the baby's room or sleeping area. Make sure to remove all pillows and stuffed animals from the baby's crib to prevent suffocation. General instructions Avoid cat litter boxes and soil used by cats. These carry germs that can cause birth defects in the baby. If you have a cat, ask someone to clean the litter box for you. Do not douche or use tampons. Do not use scented sanitary pads. Do not use any products that contain nicotine or tobacco, such as cigarettes, e-cigarettes, and chewing tobacco. If you need help quitting, ask your health care provider. Do not use any herbal remedies, illegal drugs, or medicines that were not prescribed to you. Chemicals in these products can harm your baby. Do not drink alcohol. You will have more frequent prenatal exams during the third trimester. During a routine prenatal visit, your health care provider will do a physical exam,  perform tests, and discuss your overall health. Keep all follow-up visits. This is important. Where to find more information American Pregnancy Association: americanpregnancy.[place] and Gynecologists: PoolDevices.com.pt Office on [employer]: KeywordPortfolios.com.br Contact a health care provider if you have: A fever. Mild pelvic cramps, pelvic pressure, or nagging pain in your abdominal area or lower back. Vomiting or diarrhea. Bad-smelling vaginal discharge or foul-smelling urine. Pain when you urinate. A headache that does not go away when you take medicine. Visual changes or see spots in front of your eyes. Get help right away if: Your water breaks. You have regular contractions less than 5 minutes apart. You have spotting or bleeding from your vagina. You have severe abdominal pain. You have difficulty breathing. You have chest pain. You have fainting spells. You have not felt your baby move for the time period told by your health care provider. You have new or increased pain, swelling, or redness in an arm or leg. Summary The third trimester of pregnancy is from week 28 through week 40 (months 7 through 9). You may have more problems sleeping. This can be caused by the size of your abdomen, an increased need to urinate, and an increase in your body's metabolism. You will have more frequent prenatal exams during the third trimester. Keep all follow-up visits. This is important. This information is not intended to replace advice given to you by your health care provider. Make sure you discuss any questions you have with your health care provider. Document Revised: 04/25/2020 Document Reviewed: 03/01/2020 Elsevier Patient Education  [place].

## 2023-03-03 ENCOUNTER — Ambulatory Visit: Payer: Medicaid Other | Attending: Family Medicine

## 2023-03-03 ENCOUNTER — Ambulatory Visit: Payer: Medicaid Other | Admitting: *Deleted

## 2023-03-03 VITALS — BP 129/71 | HR 96

## 2023-03-03 DIAGNOSIS — O99213 Obesity complicating pregnancy, third trimester: Secondary | ICD-10-CM | POA: Diagnosis not present

## 2023-03-03 DIAGNOSIS — Z363 Encounter for antenatal screening for malformations: Secondary | ICD-10-CM | POA: Diagnosis not present

## 2023-03-03 DIAGNOSIS — Z3490 Encounter for supervision of normal pregnancy, unspecified, unspecified trimester: Secondary | ICD-10-CM | POA: Diagnosis present

## 2023-03-03 DIAGNOSIS — Z3A31 31 weeks gestation of pregnancy: Secondary | ICD-10-CM | POA: Insufficient documentation

## 2023-03-03 DIAGNOSIS — Z148 Genetic carrier of other disease: Secondary | ICD-10-CM

## 2023-03-03 DIAGNOSIS — Z3687 Encounter for antenatal screening for uncertain dates: Secondary | ICD-10-CM | POA: Insufficient documentation

## 2023-03-04 ENCOUNTER — Other Ambulatory Visit (INDEPENDENT_AMBULATORY_CARE_PROVIDER_SITE_OTHER): Payer: Medicaid Other

## 2023-03-04 DIAGNOSIS — Z23 Encounter for immunization: Secondary | ICD-10-CM

## 2023-03-04 DIAGNOSIS — Z3490 Encounter for supervision of normal pregnancy, unspecified, unspecified trimester: Secondary | ICD-10-CM

## 2023-03-05 LAB — CBC
Hematocrit: 36.4 % (ref 34.0–46.6)
Hemoglobin: 12.2 g/dL (ref 11.1–15.9)
MCH: 30.3 pg (ref 26.6–33.0)
MCHC: 33.5 g/dL (ref 31.5–35.7)
MCV: 90 fL (ref 79–97)
Platelets: 180 10*3/uL (ref 150–450)
RBC: 4.03 x10E6/uL (ref 3.77–5.28)
RDW: 12.7 % (ref 11.7–15.4)
WBC: 13.8 10*3/uL — ABNORMAL HIGH (ref 3.4–10.8)

## 2023-03-05 LAB — RPR: RPR Ser Ql: NONREACTIVE

## 2023-03-05 LAB — HIV ANTIBODY (ROUTINE TESTING W REFLEX): HIV Screen 4th Generation wRfx: NONREACTIVE

## 2023-03-05 LAB — GLUCOSE TOLERANCE, 2 HOURS W/ 1HR
Glucose, 1 hour: 118 mg/dL (ref 70–179)
Glucose, 2 hour: 96 mg/dL (ref 70–152)
Glucose, Fasting: 76 mg/dL (ref 70–91)

## 2023-03-12 ENCOUNTER — Encounter: Payer: Self-pay | Admitting: Obstetrics and Gynecology

## 2023-03-12 ENCOUNTER — Other Ambulatory Visit: Payer: Medicaid Other

## 2023-03-12 ENCOUNTER — Encounter: Payer: Self-pay | Admitting: *Deleted

## 2023-03-12 ENCOUNTER — Ambulatory Visit (INDEPENDENT_AMBULATORY_CARE_PROVIDER_SITE_OTHER): Payer: Medicaid Other | Admitting: Obstetrics and Gynecology

## 2023-03-12 VITALS — BP 123/78 | HR 98 | Wt 180.0 lb

## 2023-03-12 DIAGNOSIS — Z3A32 32 weeks gestation of pregnancy: Secondary | ICD-10-CM

## 2023-03-12 DIAGNOSIS — Z3490 Encounter for supervision of normal pregnancy, unspecified, unspecified trimester: Secondary | ICD-10-CM

## 2023-03-12 DIAGNOSIS — Z148 Genetic carrier of other disease: Secondary | ICD-10-CM

## 2023-03-12 NOTE — Progress Notes (Addendum)
   PRENATAL VISIT NOTE  Subjective:  Ashley Byrd is a 22 y.o. G1P0000 at [redacted]w[redacted]d being seen today for ongoing prenatal care.  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  Patient reports no complaints.  Contractions: Not present. Vag. Bleeding: None.  Movement: Present. Denies leaking of fluid.   The following portions of the patient's history were reviewed and updated as appropriate: allergies, current medications, past family history, past medical history, past social history, past surgical history and problem list.   Objective:   Vitals:   03/12/23 0832  BP: 123/78  Pulse: 98  Weight: 180 lb (81.6 kg)    Fetal Status: Fetal Heart Rate (bpm): 139 Fundal Height: 32 cm Movement: Present     General:  Alert, oriented and cooperative. Patient is in no acute distress.  Skin: Skin is warm and dry. No rash noted.   Cardiovascular: Normal heart rate noted  Respiratory: Normal respiratory effort, no problems with respiration noted  Abdomen: Soft, gravid, appropriate for gestational age.  Pain/Pressure: Present     Pelvic: Cervical exam deferred        Extremities: Normal range of motion.  Edema: Mild pitting, slight indentation  Mental Status: Normal mood and affect. Normal behavior. Normal judgment and thought content.   Assessment and Plan:  Pregnancy: G1P0000 at [redacted]w[redacted]d 1. Encounter for supervision of normal pregnancy, antepartum, unspecified gravidity 28wk labs wnl. Anatomy and growth u/s wnl, rpt PRN D/w her re: birth control next visit  2. [redacted] weeks gestation of pregnancy  3. 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 No follow up needed.   Preterm labor symptoms and general obstetric precautions including but not limited to  vaginal bleeding, contractions, leaking of fluid and fetal movement were reviewed in detail with the patient. Please refer to After Visit Summary for other counseling recommendations.   No follow-ups on file.  Future Appointments  Date Time Provider Department Center  03/26/2023 11:15 AM Anyanwu, Jethro Bastos, MD [facility]  04/09/2023 11:15 AM Calvert Cantor, CNM [facility]  04/16/2023 10:15 AM Calvert Cantor, CNM [facility]  04/23/2023 10:55 AM Macon Large, Jethro Bastos, MD [facility]  04/30/2023 10:55 AM Calvert Cantor, CNM [facility]    [place] Bing, MD

## 2023-03-12 NOTE — Progress Notes (Signed)
ROB

## 2023-03-19 ENCOUNTER — Encounter: Payer: Self-pay | Admitting: *Deleted

## 2023-03-26 ENCOUNTER — Encounter: Payer: Self-pay | Admitting: Obstetrics & Gynecology

## 2023-03-26 ENCOUNTER — Telehealth (INDEPENDENT_AMBULATORY_CARE_PROVIDER_SITE_OTHER): Payer: Medicaid Other | Admitting: Obstetrics & Gynecology

## 2023-03-26 VITALS — BP 126/83 | HR 102

## 2023-03-26 DIAGNOSIS — Z3403 Encounter for supervision of normal first pregnancy, third trimester: Secondary | ICD-10-CM

## 2023-03-26 DIAGNOSIS — Z3400 Encounter for supervision of normal first pregnancy, unspecified trimester: Secondary | ICD-10-CM

## 2023-03-26 DIAGNOSIS — Z3A34 34 weeks gestation of pregnancy: Secondary | ICD-10-CM

## 2023-03-26 NOTE — Progress Notes (Signed)
OBSTETRICS PRENATAL VIRTUAL VISIT ENCOUNTER NOTE  Provider location: [facility]   Patient location: Home  I connected with Ashley Byrd on 03/26/23 at 11:15 AM EDT by MyChart Video Encounter and verified that I am speaking with the correct person using two identifiers. I discussed the limitations, risks, security and privacy concerns of performing an evaluation and management service virtually and the availability of in person appointments. I also discussed with the patient that there may be a patient responsible charge related to this service. The patient expressed understanding and agreed to proceed. Subjective:  Ashley Byrd is a 22 y.o. G1P0000 at [redacted]w[redacted]d being seen today for ongoing prenatal care.  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  Patient reports no complaints.  Contractions: Not present. Vag. Bleeding: None.  Movement: Present. Denies any leaking of fluid.   The following portions of the patient's history were reviewed and updated as appropriate: allergies, current medications, past family history, past medical history, past social history, past surgical history and problem list.   Objective:   Vitals:   03/26/23 1119  BP: 126/83  Pulse: (!) 102    Fetal Status:     Movement: Present     General:  Alert, oriented and cooperative. Patient is in no acute distress.  Respiratory: Normal respiratory effort, no problems with respiration noted  Mental Status: Normal mood and affect. Normal behavior. Normal judgment and thought content.  Rest of physical exam deferred due to type of encounter  Imaging: [place] MFM OB DETAIL +14 WK  Result Date:  03/03/2023 ----------------------------------------------------------------------  OBSTETRICS REPORT                       (Signed Final 03/03/2023 10:05 am) ---------------------------------------------------------------------- Patient Info  ID #:       [id number]                          D.O.B.:  23-May-2001 (22 yrs)  Name:       Ashley Byrd              Visit Date: 03/03/2023 08:37 am ---------------------------------------------------------------------- Performed By  Attending:        Noralee Space MD        Ref. Address:     [address]                                                             [address]  Performed By:     Eden Lathe BS      [facility]  Referred By:      [facility] ---------------------------------------------------------------------- Orders  #  Description                           Code        Ordered By  1  [place] MFM OB DETAIL +14 WK               [id number]    Tinnie Gens ----------------------------------------------------------------------  #  Order #                     Accession #                Episode #  1  [id number]                   [id number]                 [id number] ---------------------------------------------------------------------- Indications  [redacted] weeks gestation of pregnancy                Z3A.31  Antenatal screening for malformations          Z36.3  Genetic carrier (premutation carrier for       Z14.8  Fragile X-61 CGG/32 normal allele)  Encounter for uncertain dates                  Z36.87  LR NIPS/neg AFP ---------------------------------------------------------------------- Fetal Evaluation  Num Of Fetuses:         1  Fetal Heart Rate(bpm):  145  Cardiac Activity:       Observed  Presentation:           Cephalic  Placenta:                Fundal  P. Cord Insertion:      Visualized  Amniotic Fluid  AFI FV:      Within normal limits  AFI Sum(cm)     %Tile       Largest Pocket(cm)  16.62           60          7.61  RUQ(cm)       RLQ(cm)       LUQ(cm)        LLQ(cm)  2.6           2.88          7.61           3.53 ---------------------------------------------------------------------- Biometry  BPD:      82.6  mm     G. Age:  33w 2d         88  %    CI:        79.02   %    70 - 86  FL/HC:      20.0   %    19.3 - 21.3  HC:      293.8  mm     G. Age:  32w 3d         40  %    HC/AC:      1.07        0.96 - 1.17  AC:      275.4  mm     G. Age:  31w 4d         53  %    FL/BPD:     71.3   %    71 - 87  FL:       58.9  mm     G. Age:  30w 5d         19  %    FL/AC:      21.4   %    20 - 24  HUM:      52.9  mm     G. Age:  30w 6d         39  %  CER:      39.6  mm     G. Age:  32w 1d         46  %  LV:        5.6  mm  CM:        7.1  mm  Est. FW:    1790  gm    3 lb 15 oz      42  % ---------------------------------------------------------------------- OB History  Gravidity:    1         Term:   0        Prem:   0        SAB:   0  TOP:          0       Ectopic:  0        Living: 0 ---------------------------------------------------------------------- Gestational Age  U/S Today:     32w 0d                                        EDD:   04/28/23  Best:          31w 3d     Det. By:  Previous Ultrasound      EDD:   05/02/23                                      (12/11/22) ---------------------------------------------------------------------- Anatomy  Cranium:               Appears normal         Aortic Arch:            Appears normal  Cavum:                 Appears normal         Ductal Arch:            Not well visualized  Ventricles:            Appears normal         Diaphragm:              Appears normal  Choroid Plexus:  Appears normal         Stomach:                Appears normal, left                                                                         sided  Cerebellum:            Appears normal         Abdomen:                Appears normal  Posterior Fossa:       Appears normal         Abdominal Wall:         Appears nml (cord                                                                        insert, abd wall)  Nuchal Fold:           Not applicable (>20    Cord Vessels:           Appears normal ([redacted]                         wks GA)                                        vessel cord)  Face:                  Appears normal         Kidneys:                Appear normal                         (orbits and profile)  Lips:                  Appears normal         Bladder:                Appears normal  Thoracic:              Appears normal         Spine:                  Appears normal  Heart:                 Appears normal         Upper Extremities:      Appears normal                         (4CH, axis, and  situs)  RVOT:                  Appears normal         Lower Extremities:      Appears normal  LVOT:                  Appears normal  Other:  Heels, nasal bone, lenses, maxilla, mandible and falx visualized. VC,          3VV and 3VTV visualized. Fetus appears to be female. ---------------------------------------------------------------------- Cervix Uterus Adnexa  Cervix  Normal appearance by transabdominal scan  Uterus  No abnormality visualized.  Right Ovary  Within normal limits.  Left Ovary  Within normal limits.  Cul De Sac  No free fluid seen.  Adnexa  No abnormality visualized ---------------------------------------------------------------------- Impression  G1 P0. Transfer of prenatal care to [facility].  Her pregnancy is dated by 19-week ultrasound.  She is  unsure of her LMP dates.  On cell-free fetal DNA screening, the risks of fetal  aneuploidies are not increased.  MSAFP screening showed  low risk for open neural tube defects.  She is a premeditation  carrier for Fragile X syndrome (61  CGG repeats/normal allele 32).  Patient reports she had  genetic counseling and had opted not to have amniocentesis.  Patient will be screening for gestational diabetes next week.  On ultrasound, amniotic fluid is normal good fetal activity  seen.  Fetal growth is appropriate for gestational age.  Fetal  anatomical survey appears normal but limited by advanced  gestational age.  Female fetus.  I encouraged her to screen for gestational diabetes. ---------------------------------------------------------------------- Recommendations  -No follow-up appointments were made . ----------------------------------------------------------------------                 Noralee Space, MD Electronically Signed Final Report   03/03/2023 10:05 am ----------------------------------------------------------------------   Assessment and Plan:  Pregnancy: G1P0000 at [redacted]w[redacted]d 1. [redacted] weeks gestation of pregnancy 2. Supervision of normal first pregnancy, antepartum No concerns. Pelvic cultures next visit.  Desires inpatient Nexplanon placement. Preterm labor symptoms and general obstetric precautions including but not limited to vaginal bleeding, contractions, leaking of fluid and fetal movement were reviewed in detail with the patient. I discussed the assessment and treatment plan with the patient. The patient was provided an opportunity to ask questions and all were answered. The patient agreed with the plan and demonstrated an understanding of the instructions. The patient was advised to call back or seek an in-person office evaluation/go to MAU at [facility] for any urgent or concerning symptoms. Please refer to After Visit Summary for other counseling recommendations.   I provided 5 minutes of face-to-face time during this encounter.  Return in about 2 weeks (around 04/09/2023) for OB visit as scheduled.  Future Appointments  Date Time Provider Department Center  04/09/2023  11:15 AM Calvert Cantor, CNM [facility]  04/16/2023 10:15 AM Calvert Cantor, CNM [facility]  04/23/2023 10:55 AM Macon Large, Jethro Bastos, MD [facility]  04/30/2023 10:55 AM Calvert Cantor, CNM [facility]    Jaynie Collins, MD [facility] for [facility], [facility]

## 2023-03-26 NOTE — Patient Instructions (Signed)
Return to office for any scheduled appointments. Call the office or go to the MAU at [facility] if: You begin to have strong, frequent contractions Your water breaks.  Sometimes it is a big gush of fluid, sometimes it is just a trickle that keeps getting your underwear wet or running down your legs You have vaginal bleeding.  It is normal to have a small amount of spotting if your cervix was checked.  You do not feel your baby moving like normal.  If you do not, get something to eat and drink and lay down and focus on feeling your baby move.   If your baby is still not moving like normal, you should call the office or go to MAU. Any other obstetric concerns.  

## 2023-03-31 ENCOUNTER — Encounter: Payer: Self-pay | Admitting: Obstetrics & Gynecology

## 2023-04-09 ENCOUNTER — Ambulatory Visit (INDEPENDENT_AMBULATORY_CARE_PROVIDER_SITE_OTHER): Payer: Medicaid Other | Admitting: Advanced Practice Midwife

## 2023-04-09 ENCOUNTER — Other Ambulatory Visit (HOSPITAL_COMMUNITY)
Admission: RE | Admit: 2023-04-09 | Discharge: 2023-04-09 | Disposition: A | Discharge status: Home / Self Care | Payer: Medicaid Other | Source: Ambulatory Visit | Attending: Advanced Practice Midwife | Admitting: Advanced Practice Midwife

## 2023-04-09 VITALS — BP 133/87 | HR 89 | Wt 196.0 lb

## 2023-04-09 DIAGNOSIS — Z3A36 36 weeks gestation of pregnancy: Secondary | ICD-10-CM | POA: Insufficient documentation

## 2023-04-09 DIAGNOSIS — Z3483 Encounter for supervision of other normal pregnancy, third trimester: Secondary | ICD-10-CM | POA: Insufficient documentation

## 2023-04-09 DIAGNOSIS — O368130 Decreased fetal movements, third trimester, not applicable or unspecified: Secondary | ICD-10-CM

## 2023-04-09 DIAGNOSIS — Z3400 Encounter for supervision of normal first pregnancy, unspecified trimester: Secondary | ICD-10-CM

## 2023-04-09 NOTE — Progress Notes (Signed)
   PRENATAL VISIT NOTE  Subjective:  Ashley Byrd is a 22 y.o. G1P0000 at [redacted]w[redacted]d being seen today for ongoing prenatal care.  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  Patient reports  decreased fetal movement for "about the last week and a half" .  Contractions: Not present. Vag. Bleeding: None.  Movement: (!) Decreased. Denies leaking of fluid.   Patient has eaten an apple today. She states she was planning to eat lunch after her appointment.  The following portions of the patient's history were reviewed and updated as appropriate: allergies, current medications, past family history, past medical history, past social history, past surgical history and problem list. Problem list updated.  Objective:   Vitals:   04/09/23 1115  BP: 133/87  Pulse: 89  Weight: 196 lb (88.9 kg)    Fetal Status: Fetal Heart Rate (bpm): 136   Movement: (!) Decreased     General:  Alert, oriented and cooperative. Patient is in no acute distress.  Skin: Skin is warm and dry. No rash noted.   Cardiovascular: Normal heart rate noted  Respiratory: Normal respiratory effort, no problems with respiration noted  Abdomen: Soft, gravid, appropriate for gestational age.  Pain/Pressure: Present     Pelvic: Cervical exam deferred      Declined by patient  Extremities: Normal range of motion.  Edema: None  Mental Status: Normal mood and affect. Normal behavior. Normal judgment and thought content.   Assessment and Plan:  Pregnancy: G1P0000 at [redacted]w[redacted]d  1. Supervision of normal first pregnancy, antepartum - Routine care, discussed labor eval, indications for evaluation in MAU vs office  2. [redacted] weeks gestation of pregnancy  - Cervicovaginal ancillary only -  Strep Gp B NAA  3. Decreased fetal movements in third trimester, single or unspecified fetus - x 1.5 weeks, 2 or 3 movements last night which is typically baby's busy time - No fetal movement with repositioning for swab collection or with CNM performing Leopolds - Given 1 can Coke and placed on monitor - Reactive NST, patient endorsing very active fetal movement after consuming small can of Coke - Reviewed interventions for low kick number, indications for evaluation in MAU  Preterm labor symptoms and general obstetric precautions including but not limited to vaginal bleeding, contractions, leaking of fluid and fetal movement were reviewed in detail with the patient. Please refer to After Visit Summary for other counseling recommendations.    Future Appointments  Date Time Provider Department Center  04/16/2023 10:15 AM Calvert Cantor, CNM [facility]  04/23/2023 10:55 AM Anyanwu, Jethro Bastos, MD [facility]  04/30/2023 10:55 AM Calvert Cantor, CNM [facility]    Calvert Cantor, CNM

## 2023-04-09 NOTE — Progress Notes (Signed)
CC: Routine OB  Swelling increased and decreased fetal movement  Sometimes she sees dots visually and feels that she shakes a lot and has heartburn  Denies any headaches

## 2023-04-10 ENCOUNTER — Encounter: Payer: Self-pay | Admitting: Advanced Practice Midwife

## 2023-04-10 LAB — CERVICOVAGINAL ANCILLARY ONLY
Chlamydia: NEGATIVE
Comment: NEGATIVE
Comment: NORMAL
Neisseria Gonorrhea: NEGATIVE

## 2023-04-11 ENCOUNTER — Encounter: Payer: Self-pay | Admitting: Advanced Practice Midwife

## 2023-04-11 ENCOUNTER — Other Ambulatory Visit: Payer: Self-pay

## 2023-04-11 ENCOUNTER — Inpatient Hospital Stay (HOSPITAL_COMMUNITY)
Admission: AD | Admit: 2023-04-11 | Discharge: 2023-04-14 | DRG: 807 | Disposition: A | Discharge status: Home / Self Care | Payer: Medicaid Other | Attending: Obstetrics & Gynecology | Admitting: Obstetrics & Gynecology

## 2023-04-11 ENCOUNTER — Encounter (HOSPITAL_COMMUNITY): Payer: Self-pay | Admitting: Obstetrics and Gynecology

## 2023-04-11 DIAGNOSIS — Z3A37 37 weeks gestation of pregnancy: Secondary | ICD-10-CM | POA: Diagnosis not present

## 2023-04-11 DIAGNOSIS — O1494 Unspecified pre-eclampsia, complicating childbirth: Secondary | ICD-10-CM | POA: Diagnosis present

## 2023-04-11 DIAGNOSIS — O1414 Severe pre-eclampsia complicating childbirth: Secondary | ICD-10-CM | POA: Diagnosis present

## 2023-04-11 DIAGNOSIS — O99824 Streptococcus B carrier state complicating childbirth: Secondary | ICD-10-CM | POA: Diagnosis present

## 2023-04-11 DIAGNOSIS — Z148 Genetic carrier of other disease: Secondary | ICD-10-CM | POA: Diagnosis not present

## 2023-04-11 DIAGNOSIS — Z22330 Carrier of Group B streptococcus: Secondary | ICD-10-CM

## 2023-04-11 DIAGNOSIS — O1410 Severe pre-eclampsia, unspecified trimester: Secondary | ICD-10-CM | POA: Insufficient documentation

## 2023-04-11 DIAGNOSIS — Z3490 Encounter for supervision of normal pregnancy, unspecified, unspecified trimester: Secondary | ICD-10-CM

## 2023-04-11 DIAGNOSIS — O99820 Streptococcus B carrier state complicating pregnancy: Secondary | ICD-10-CM | POA: Diagnosis not present

## 2023-04-11 LAB — COMPREHENSIVE METABOLIC PANEL
ALT: 15 U/L (ref 0–44)
ALT: 16 U/L (ref 0–44)
AST: 22 U/L (ref 15–41)
AST: 22 U/L (ref 15–41)
Albumin: 2.4 g/dL — ABNORMAL LOW (ref 3.5–5.0)
Albumin: 2.6 g/dL — ABNORMAL LOW (ref 3.5–5.0)
Alkaline Phosphatase: 105 U/L (ref 38–126)
Alkaline Phosphatase: 105 U/L (ref 38–126)
Anion gap: 13 (ref 5–15)
Anion gap: 11 (ref 5–15)
BUN: 14 mg/dL (ref 6–20)
BUN: 14 mg/dL (ref 6–20)
CO2: 20 mmol/L — ABNORMAL LOW (ref 22–32)
CO2: 21 mmol/L — ABNORMAL LOW (ref 22–32)
Calcium: 9.4 mg/dL (ref 8.9–10.3)
Calcium: 9.2 mg/dL (ref 8.9–10.3)
Chloride: 104 mmol/L (ref 98–111)
Chloride: 105 mmol/L (ref 98–111)
Creatinine, Ser: 0.58 mg/dL (ref 0.44–1.00)
Creatinine, Ser: 0.63 mg/dL (ref 0.44–1.00)
GFR, Estimated: >60 mL/min (ref >60)
GFR, Estimated: >60 mL/min (ref >60)
Glucose, Bld: 99 mg/dL (ref 70–99)
Glucose, Bld: 95 mg/dL (ref 70–99)
Potassium: 4.1 mmol/L (ref 3.5–5.1)
Potassium: 4.6 mmol/L (ref 3.5–5.1)
Sodium: 137 mmol/L (ref 135–145)
Sodium: 137 mmol/L (ref 135–145)
Total Bilirubin: 0.6 mg/dL (ref 0.3–1.2)
Total Bilirubin: 0.7 mg/dL (ref 0.3–1.2)
Total Protein: 5.7 g/dL — ABNORMAL LOW (ref 6.5–8.1)
Total Protein: 6.2 g/dL — ABNORMAL LOW (ref 6.5–8.1)

## 2023-04-11 LAB — STREP GP B NAA: Strep Gp B NAA: POSITIVE — AB

## 2023-04-11 LAB — POCT FERN TEST: POCT Fern Test: NEGATIVE

## 2023-04-11 LAB — CBC
HCT: 36.3 % (ref 36.0–46.0)
HCT: 38 % (ref 36.0–46.0)
Hemoglobin: 12.3 g/dL (ref 12.0–15.0)
Hemoglobin: 12.9 g/dL (ref 12.0–15.0)
MCH: 30.2 pg (ref 26.0–34.0)
MCH: 30.5 pg (ref 26.0–34.0)
MCHC: 33.9 g/dL (ref 30.0–36.0)
MCHC: 33.9 g/dL (ref 30.0–36.0)
MCV: 89.2 fL (ref 80.0–100.0)
MCV: 89.8 fL (ref 80.0–100.0)
Platelets: 173 10*3/uL (ref 150–400)
Platelets: 191 10*3/uL (ref 150–400)
RBC: 4.07 MIL/uL (ref 3.87–5.11)
RBC: 4.23 MIL/uL (ref 3.87–5.11)
RDW: 13 % (ref 11.5–15.5)
RDW: 13.2 % (ref 11.5–15.5)
WBC: 11.4 10*3/uL — ABNORMAL HIGH (ref 4.0–10.5)
WBC: 17.3 10*3/uL — ABNORMAL HIGH (ref 4.0–10.5)
nRBC: 0 % (ref 0.0–0.2)
nRBC: 0 % (ref 0.0–0.2)

## 2023-04-11 LAB — PROTEIN / CREATININE RATIO, URINE
Creatinine, Urine: 150 mg/dL
Protein Creatinine Ratio: 0.69 mg/mg{Cre} — ABNORMAL HIGH (ref 0.00–0.15)
Total Protein, Urine: 103 mg/dL

## 2023-04-11 LAB — MAGNESIUM: Magnesium: 4.1 mg/dL — ABNORMAL HIGH (ref 1.7–2.4)

## 2023-04-11 LAB — TYPE AND SCREEN
ABO/RH(D): A POS
Antibody Screen: NEGATIVE

## 2023-04-11 MED ORDER — MAGNESIUM SULFATE 40 GM/1000ML IV SOLN
1.0000 g/h | INTRAVENOUS | Status: DC
  Administered 2023-04-12: 1 g/h via INTRAVENOUS
  Filled 2023-04-11 (×2): qty 1000

## 2023-04-11 MED ORDER — LACTATED RINGERS IV SOLN: INTRAVENOUS | Status: DC

## 2023-04-11 MED ORDER — LABETALOL HCL 5 MG/ML IV SOLN: 80.0000 mg | INTRAVENOUS | Status: DC | PRN

## 2023-04-11 MED ORDER — PENICILLIN G POT IN DEXTROSE 60000 UNIT/ML IV SOLN
3.0000 10*6.[IU] | INTRAVENOUS | Status: DC
  Administered 2023-04-11 – 2023-04-12 (×5): 3 10*6.[IU] via INTRAVENOUS
  Filled 2023-04-11 (×8): qty 50

## 2023-04-11 MED ORDER — SODIUM CHLORIDE 0.9 % IV SOLN
5.0000 10*6.[IU] | Freq: Once | INTRAVENOUS | Status: AC
  Administered 2023-04-11: 5 10*6.[IU] via INTRAVENOUS
  Filled 2023-04-11: qty 5

## 2023-04-11 MED ORDER — FENTANYL CITRATE (PF) 100 MCG/2ML IJ SOLN
INTRAMUSCULAR | Status: AC
  Filled 2023-04-11: qty 2

## 2023-04-11 MED ORDER — OXYTOCIN-SODIUM CHLORIDE 30-0.9 UT/500ML-% IV SOLN
2.5000 [IU]/h | INTRAVENOUS | Status: DC
  Administered 2023-04-12: 2.5 [IU]/h via INTRAVENOUS
  Filled 2023-04-11: qty 500

## 2023-04-11 MED ORDER — ONDANSETRON HCL 4 MG/2ML IJ SOLN
4.0000 mg | Freq: Four times a day (QID) | INTRAMUSCULAR | Status: DC | PRN
  Administered 2023-04-11 – 2023-04-12 (×2): 4 mg via INTRAVENOUS
  Filled 2023-04-11 (×2): qty 2

## 2023-04-11 MED ORDER — ACETAMINOPHEN 325 MG PO TABS: 650.0000 mg | ORAL_TABLET | ORAL | Status: DC | PRN

## 2023-04-11 MED ORDER — LABETALOL HCL 5 MG/ML IV SOLN: 40.0000 mg | INTRAVENOUS | Status: DC | PRN

## 2023-04-11 MED ORDER — TERBUTALINE SULFATE 1 MG/ML IJ SOLN: 0.2500 mg | Freq: Once | INTRAMUSCULAR | Status: DC | PRN

## 2023-04-11 MED ORDER — LABETALOL HCL 5 MG/ML IV SOLN: 20.0000 mg | INTRAVENOUS | Status: DC | PRN

## 2023-04-11 MED ORDER — MISOPROSTOL 25 MCG QUARTER TABLET
25.0000 ug | ORAL_TABLET | Freq: Once | ORAL | Status: AC
  Administered 2023-04-11: 25 ug via VAGINAL
  Filled 2023-04-11: qty 1

## 2023-04-11 MED ORDER — LIDOCAINE HCL (PF) 1 % IJ SOLN: 30.0000 mL | INTRAMUSCULAR | Status: DC | PRN

## 2023-04-11 MED ORDER — MISOPROSTOL 50MCG HALF TABLET
50.0000 ug | ORAL_TABLET | Freq: Once | ORAL | Status: AC
  Administered 2023-04-11: 50 ug via ORAL
  Filled 2023-04-11: qty 1

## 2023-04-11 MED ORDER — OXYCODONE-ACETAMINOPHEN 5-325 MG PO TABS: 1.0000 | ORAL_TABLET | ORAL | Status: DC | PRN

## 2023-04-11 MED ORDER — MAGNESIUM SULFATE BOLUS VIA INFUSION
4.0000 g | Freq: Once | INTRAVENOUS | Status: AC
  Administered 2023-04-11: 4 g via INTRAVENOUS
  Filled 2023-04-11: qty 1000

## 2023-04-11 MED ORDER — FENTANYL CITRATE (PF) 100 MCG/2ML IJ SOLN
50.0000 ug | INTRAMUSCULAR | Status: DC | PRN
  Administered 2023-04-11 – 2023-04-12 (×6): 100 ug via INTRAVENOUS
  Filled 2023-04-11 (×5): qty 2

## 2023-04-11 MED ORDER — OXYTOCIN BOLUS FROM INFUSION
333.0000 mL | Freq: Once | INTRAVENOUS | Status: AC
  Administered 2023-04-12: 600 mL/h via INTRAVENOUS

## 2023-04-11 MED ORDER — OXYCODONE-ACETAMINOPHEN 5-325 MG PO TABS: 2.0000 | ORAL_TABLET | ORAL | Status: DC | PRN

## 2023-04-11 MED ORDER — HYDRALAZINE HCL 20 MG/ML IJ SOLN: 10.0000 mg | INTRAMUSCULAR | Status: DC | PRN

## 2023-04-11 MED ORDER — SOD CITRATE-CITRIC ACID 500-334 MG/5ML PO SOLN: 30.0000 mL | ORAL | Status: DC | PRN

## 2023-04-11 MED ORDER — LACTATED RINGERS IV SOLN: 500.0000 mL | INTRAVENOUS | Status: DC | PRN

## 2023-04-11 NOTE — Progress Notes (Signed)
Labor Progress Note Ashley Byrd is a 22 y.o. G1P0000 at 105w0d presented for IOL 2/2 severe preE.   S: No acute concerns.   O:  BP 123/73 (BP Location: Right Arm)   Pulse 89   Temp 98.1 F (36.7 C) (Oral)   Resp 17   Ht 5' (1.524 m)   Wt 90.2 kg   LMP 08/05/2022   SpO2 98%   BMI 38.83 kg/m  EFM: 120bpm/moderate/+accels, no decels  CVE: Dilation: Fingertip Effacement (%): 70 Station: -2 Presentation: Vertex Exam by:: Dr. Nobie Putnam   A&P: 22 y.o. G1P0000 [redacted]w[redacted]d here for IOL 2/2 severe preE.  #Labor: FB in place. Last cytotec 1700 will plan to redose cytotec while foley in if contactions space out.  #Pain: Maternally supported.  #FWB: Cat I  #GBS positive  Severe preE  BP mild range. Intermittent vision changeds -Start magnesium -Start q12 hour labs  [employer], DO 9:57 PM

## 2023-04-11 NOTE — MAU Note (Signed)
Pt informed that the ultrasound is considered a limited OB ultrasound and is not intended to be a complete ultrasound exam.  Patient also informed that the ultrasound is not being completed with the intent of assessing for fetal or placental anomalies or any pelvic abnormalities.  Explained that the purpose of today's ultrasound is to assess for presentation.  Patient acknowledges the purpose of the exam and the limitations of the study.    Vertex presentation confirmed by bedside US.

## 2023-04-11 NOTE — Plan of Care (Signed)
  Problem: Education: Goal: Knowledge of disease or condition will improve Outcome: Progressing Goal: Knowledge of the prescribed therapeutic regimen will improve Outcome: Progressing   Problem: Fluid Volume: Goal: Peripheral tissue perfusion will improve Outcome: Progressing   Problem: Clinical Measurements: Goal: Complications related to disease process, condition or treatment will be avoided or minimized Outcome: Progressing   Problem: Education: Goal: Knowledge of Childbirth will improve Outcome: Progressing Goal: Ability to make informed decisions regarding treatment and plan of care will improve Outcome: Progressing Goal: Ability to state and carry out methods to decrease the pain will improve Outcome: Progressing Goal: Individualized Educational Video(s) Outcome: Progressing   Problem: Coping: Goal: Ability to verbalize concerns and feelings about labor and delivery will improve Outcome: Progressing   Problem: Life Cycle: Goal: Ability to make normal progression through stages of labor will improve Outcome: Progressing Goal: Ability to effectively push during vaginal delivery will improve Outcome: Progressing   Problem: Role Relationship: Goal: Will demonstrate positive interactions with the child Outcome: Progressing   Problem: Safety: Goal: Risk of complications during labor and delivery will decrease Outcome: Progressing   Problem: Pain Management: Goal: Relief or control of pain from uterine contractions will improve Outcome: Progressing   Problem: Education: Goal: Knowledge of General Education information will improve Description: Including pain rating scale, medication(s)/side effects and non-pharmacologic comfort measures Outcome: Progressing   Problem: Health Behavior/Discharge Planning: Goal: Ability to manage health-related needs will improve Outcome: Progressing   Problem: Clinical Measurements: Goal: Ability to maintain clinical measurements  within normal limits will improve Outcome: Progressing Goal: Will remain free from infection Outcome: Progressing Goal: Diagnostic test results will improve Outcome: Progressing Goal: Respiratory complications will improve Outcome: Progressing Goal: Cardiovascular complication will be avoided Outcome: Progressing   Problem: Activity: Goal: Risk for activity intolerance will decrease Outcome: Progressing   Problem: Nutrition: Goal: Adequate nutrition will be maintained Outcome: Progressing   Problem: Coping: Goal: Level of anxiety will decrease Outcome: Progressing   Problem: Elimination: Goal: Will not experience complications related to bowel motility Outcome: Progressing Goal: Will not experience complications related to urinary retention Outcome: Progressing   Problem: Pain Managment: Goal: General experience of comfort will improve Outcome: Progressing   Problem: Safety: Goal: Ability to remain free from injury will improve Outcome: Progressing   Problem: Skin Integrity: Goal: Risk for impaired skin integrity will decrease Outcome: Progressing   

## 2023-04-11 NOTE — MAU Provider Note (Signed)
History     CSN: [id number]  Arrival date and time: 04/11/23 1208   Event Date/Time   First Provider Initiated Contact with Patient 04/11/23 1301      Chief Complaint  Patient presents with   Vaginal Discharge   HPI Ashley Byrd is a 22 y.o. G1P0000 at 105w0d who presents to MAU with concern for PROM. She reports leaking brown-tinged fluid earlier today. She denies contractions, frank red vaginal bleeding, decreased fetal movement, fever, falls, or recent illness. Most recent sexual intercourse last night.  Patient denies history of Hypertension. She endorses "salt and pepper flakes" in her field of vision for about 1.5 weeks. She denies headache, RUQ/epigastric pain, new onset swelling or weight gain.  OB History     Gravida  1   Para  0   Term  0   Preterm  0   AB  0   Living  0      SAB  0   IAB  0   Ectopic  0   Multiple  0   Live Births  0           Past Medical History:  Diagnosis Date   Acid reflux    Depression     Past Surgical History:  Procedure Laterality Date   NO PAST SURGERIES      Family History  Problem Relation Age of Onset   Asthma Brother    Cancer Maternal Aunt    Diabetes Maternal Grandmother    Hypertension Maternal Grandmother    Hypertension Maternal Grandfather    Heart disease Neg Hx     Social History   Tobacco Use   Smoking status: Never   Smokeless tobacco: Never  Vaping Use   Vaping Use: Former   Substances: Nicotine, Flavoring  Substance Use Topics   Alcohol use: Not Currently   Drug use: Not Currently    Types: Marijuana    Allergies: No Known Allergies  Medications Prior to Admission  Medication Sig Dispense Refill Last Dose   Prenatal Vit-Fe Fumarate-FA (PRENATAL MULTIVITAMIN) TABS tablet Take 1 tablet by mouth daily at 12 noon.   04/10/2023    Review of Systems  Eyes:  Positive for visual disturbance.  Genitourinary:  Positive for vaginal discharge.  All other systems reviewed and  are negative.  Physical Exam   Blood pressure 138/87, pulse 94, temperature 98.2 F (36.8 C), temperature source Oral, resp. rate 15, height 5' (1.524 m), weight 90.2 kg, last menstrual period 08/05/2022, SpO2 98 %.  Physical Exam Vitals and nursing note reviewed. Exam conducted with a chaperone present.  Constitutional:      Appearance: Normal appearance. She is not ill-appearing.  Cardiovascular:     Rate and Rhythm: Normal rate.  Pulmonary:     Effort: Pulmonary effort is normal.  Abdominal:     Comments: Gravid  Genitourinary:    Comments: Pelvic exam: External genitalia normal, vaginal walls pink and well rugated, cervix visually closed, no lesions noted. Negative pooling, negative gush with Valsalva.   Skin:    Capillary Refill: Capillary refill takes less than 2 seconds.  Neurological:     Mental Status: She is alert and oriented to person, place, and time.  Psychiatric:        Mood and Affect: Mood normal.        Behavior: Behavior normal.        Thought Content: Thought content normal.        Judgment: Judgment normal.  MAU Course  Procedures  MDM  Patient Vitals for the past 24 hrs:  BP Temp Temp src Pulse Resp SpO2 Height Weight  04/11/23 1530 130/80 -- -- 88 -- 98 % -- --  04/11/23 1515 138/87 -- -- 94 -- 98 % -- --  04/11/23 1500 131/81 -- -- 93 -- 98 % -- --  04/11/23 1445 125/86 -- -- 85 -- 98 % -- --  04/11/23 1430 114/82 -- -- 86 -- -- -- --  04/11/23 1415 130/77 -- -- 89 -- -- -- --  04/11/23 1400 134/80 -- -- 89 -- 99 % -- --  04/11/23 1345 137/84 -- -- 97 -- 100 % -- --  04/11/23 1330 (!) 139/95 -- -- 93 -- 99 % -- --  04/11/23 1315 (!) 138/90 -- -- 89 -- 99 % -- --  04/11/23 1300 (!) 144/90 -- -- 89 -- 99 % -- --  04/11/23 1242 137/89 -- -- 93 -- 99 % -- --  04/11/23 1237 (!) 142/91 -- -- 90 -- -- -- --  04/11/23 1221 (!) 130/91 98.2 F (36.8 C) Oral 88 15 99 % 5' (1.524 m) 90.2 kg     Orders Placed This Encounter  Procedures   CBC    Comprehensive metabolic panel   Protein / creatinine ratio, urine   RPR   Fern Test   Type and screen [facility]   Insert and maintain IV Line   Admit to Inpatient (patient's expected length of stay will be greater than 2 midnights or inpatient only procedure)   Results for orders placed or performed during the hospital encounter of 04/11/23 (from the past 24 hour(s))  Fern Test     Status: None   Collection Time: 04/11/23 12:48 PM  Result Value Ref Range   POCT Fern Test Negative = intact amniotic membranes   Protein / creatinine ratio, urine     Status: Abnormal   Collection Time: 04/11/23  1:13 PM  Result Value Ref Range   Creatinine, Urine 150 mg/dL   Total Protein, Urine 103 mg/dL   Protein Creatinine Ratio 0.69 (H) 0.00 - 0.15 mg/mg[Cre]  CBC     Status: Abnormal   Collection Time: 04/11/23  1:22 PM  Result Value Ref Range   WBC 11.4 (H) 4.0 - 10.5 K/uL   RBC 4.07 3.87 - 5.11 MIL/uL   Hemoglobin 12.3 12.0 - 15.0 g/dL   HCT 16.1 09.6 - 04.5 %   MCV 89.2 80.0 - 100.0 fL   MCH 30.2 26.0 - 34.0 pg   MCHC 33.9 30.0 - 36.0 g/dL   RDW 40.9 81.1 - 91.4 %   Platelets 173 150 - 400 K/uL   nRBC 0.0 0.0 - 0.2 %  Comprehensive metabolic panel     Status: Abnormal   Collection Time: 04/11/23  1:22 PM  Result Value Ref Range   Sodium 137 135 - 145 mmol/L   Potassium 4.1 3.5 - 5.1 mmol/L   Chloride 104 98 - 111 mmol/L   CO2 20 (L) 22 - 32 mmol/L   Glucose, Bld 99 70 - 99 mg/dL   BUN 14 6 - 20 mg/dL   Creatinine, Ser 7.82 0.44 - 1.00 mg/dL   Calcium 9.4 8.9 - 95.6 mg/dL   Total Protein 5.7 (L) 6.5 - 8.1 g/dL   Albumin 2.4 (L) 3.5 - 5.0 g/dL   AST 22 15 - 41 U/L   ALT 15 0 - 44 U/L   Alkaline Phosphatase  105 38 - 126 U/L   Total Bilirubin 0.6 0.3 - 1.2 mg/dL   GFR, Estimated >40 >98 mL/min   Anion gap 13 5 - 15    Assessment and Plan  --22 y.o. G1P0000 at [redacted]w[redacted]d  --Preeclampsia --P:Cr 0.69 --Intact amniotic sac (negative pooling, negative  fern) --Reactive tracing --Vertex by BSUS performed by V. Fisher, RN --Per Dr. Earlene Plater, admit to L&D for IOL  Calvert Cantor, MSA, MSN, CNM 04/11/2023, 4:10 PM

## 2023-04-11 NOTE — Progress Notes (Signed)
Orders received and initiated for Magnesium protocol. Patient education conducted with patient and mother. Both verbalized understanding. Maintenance fluids (primary) infusing @10ml /hr via infusion pump with line and pump labeled. Magnesium infusing via infusion pump at 54ml/hr after completion of 4 gram bolus. Line and pump labeled. SCDs placed and fall risk band to patient's left wrist. Bedside commode in room and patient understands she is to call the nurse for assistance to bedside commode. Foley bulbs remains in place with tension.

## 2023-04-11 NOTE — H&P (Addendum)
OBSTETRIC ADMISSION HISTORY AND PHYSICAL  Ashley Byrd is a 22 y.o. female G1P0000 with IUP at [redacted]w[redacted]d by LMP presenting for IOL for preeclampsia. She reports "salt and pepper flakes in her vision." She reports +FMs, No LOF, no VB, no blurry vision, headaches or peripheral edema, and RUQ pain.  She plans on breast feeding. She request  IP nexplanon for birth control. She received her prenatal care at  [facility]    Dating: By LMP --->  Estimated Date of Delivery: 05/02/23  Sono:    @[redacted]w[redacted]d , CWD, normal anatomy, cephalic presentation,  fundal placenta, 1790 g, 42% EFW   Prenatal History/Complications: Preeclampsia   Past Medical History: Past Medical History:  Diagnosis Date   Acid reflux    Depression     Past Surgical History: Past Surgical History:  Procedure Laterality Date   NO PAST SURGERIES      Obstetrical History: OB History     Gravida  1   Para  0   Term  0   Preterm  0   AB  0   Living  0      SAB  0   IAB  0   Ectopic  0   Multiple  0   Live Births  0           Social History Social History   Socioeconomic History   Marital status: Single    Spouse name: Not on file   Number of children: Not on file   Years of education: Not on file   Highest education level: Not on file  Occupational History   Not on file  Tobacco Use   Smoking status: Never   Smokeless tobacco: Never  Vaping Use   Vaping Use: Former   Substances: Nicotine, Flavoring  Substance and Sexual Activity   Alcohol use: Not Currently   Drug use: Not Currently    Types: Marijuana   Sexual activity: Yes    Comment: condom sometimes  Other Topics Concern   Not on file  Social History Narrative   Not on file   Social Determinants of Health   Financial Resource Strain: Not on file  Food Insecurity: Not on file  Transportation Needs: Not on file  Physical Activity: Not on file  Stress: Not on file  Social Connections: Not on file    Family History: Family  History  Problem Relation Age of Onset   Asthma Brother    Cancer Maternal Aunt    Diabetes Maternal Grandmother    Hypertension Maternal Grandmother    Hypertension Maternal Grandfather    Heart disease Neg Hx     Allergies: No Known Allergies  Medications Prior to Admission  Medication Sig Dispense Refill Last Dose   Prenatal Vit-Fe Fumarate-FA (PRENATAL MULTIVITAMIN) TABS tablet Take 1 tablet by mouth daily at 12 noon.   04/10/2023     Review of Systems   All systems reviewed and negative except as stated in HPI  Blood pressure 130/80, pulse 88, temperature 98.2 F (36.8 C), temperature source Oral, resp. rate 15, height 5' (1.524 m), weight 90.2 kg, last menstrual period 08/05/2022, SpO2 98 %. General appearance: cooperative and appears older than stated age Lungs: normal work of breathing  Heart: regular rate  Abdomen: soft, non-tender; bowel sounds normal Pelvic: 0.5/70/-1 Extremities: Homans sign is negative, no sign of DVT  Presentation: cephalic Fetal monitoringBaseline: 135  bpm, Variability: Good {> 6 bpm), Accelerations: Reactive, and Decelerations: Absent Uterine activityNone  Prenatal labs: ABO, Rh: --/--/PENDING (05/11 1318) Antibody: PENDING (05/11 1318) Rubella: Immune (11/17 0000) RPR: Non Reactive (04/03 0912)  HBsAg: Negative (11/17 0000)  HIV: Non Reactive (04/03 0912)  GBS: Positive/-- (05/09 1300)  1 hr Glucola Normal  Genetic screening  LR female Anatomy US Normal  Prenatal Transfer Tool  Maternal Diabetes: No Genetic Screening: Abnormal:  Results: Other: Fragile X carrier Maternal Ultrasounds/Referrals: Normal Fetal Ultrasounds or other Referrals:  None Maternal Substance Abuse:  No Significant Maternal Medications:  None Significant Maternal Lab Results:  Group B Strep positive Number of Prenatal Visits:greater than 3 verified prenatal visits Other Comments:  None  Results for orders placed or performed during the hospital  encounter of 04/11/23 (from the past 24 hour(s))  Fern Test   Collection Time: 04/11/23 12:48 PM  Result Value Ref Range   POCT Fern Test Negative = intact amniotic membranes   Protein / creatinine ratio, urine   Collection Time: 04/11/23  1:13 PM  Result Value Ref Range   Creatinine, Urine 150 mg/dL   Total Protein, Urine 103 mg/dL   Protein Creatinine Ratio 0.69 (H) 0.00 - 0.15 mg/mg[Cre]  Type and screen [facility]   Collection Time: 04/11/23  1:18 PM  Result Value Ref Range   ABO/RH(D) PENDING    Antibody Screen PENDING    Sample Expiration      04/14/2023,2359 Performed at [facility], [address], [place], [place] [address]   CBC   Collection Time: 04/11/23  1:22 PM  Result Value Ref Range   WBC 11.4 (H) 4.0 - 10.5 K/uL   RBC 4.07 3.87 - 5.11 MIL/uL   Hemoglobin 12.3 12.0 - 15.0 g/dL   HCT 47.8 29.5 - 62.1 %   MCV 89.2 80.0 - 100.0 fL   MCH 30.2 26.0 - 34.0 pg   MCHC 33.9 30.0 - 36.0 g/dL   RDW 30.8 65.7 - 84.6 %   Platelets 173 150 - 400 K/uL   nRBC 0.0 0.0 - 0.2 %  Comprehensive metabolic panel   Collection Time: 04/11/23  1:22 PM  Result Value Ref Range   Sodium 137 135 - 145 mmol/L   Potassium 4.1 3.5 - 5.1 mmol/L   Chloride 104 98 - 111 mmol/L   CO2 20 (L) 22 - 32 mmol/L   Glucose, Bld 99 70 - 99 mg/dL   BUN 14 6 - 20 mg/dL   Creatinine, Ser 9.62 0.44 - 1.00 mg/dL   Calcium 9.4 8.9 - 95.2 mg/dL   Total Protein 5.7 (L) 6.5 - 8.1 g/dL   Albumin 2.4 (L) 3.5 - 5.0 g/dL   AST 22 15 - 41 U/L   ALT 15 0 - 44 U/L   Alkaline Phosphatase 105 38 - 126 U/L   Total Bilirubin 0.6 0.3 - 1.2 mg/dL   GFR, Estimated >84 >13 mL/min   Anion gap 13 5 - 15    Patient Active Problem List   Diagnosis Date Noted   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 G1P0000 at 110w0d here for IOL for preeclampsia (visual changes)   #Labor:Induction started with FB and cytotec 50/25 #Pain: Per patient  request  #FWB: Cat 1 #ID:  GBS pos, PCN #MOF: breast #MOC Declines at this time  #Pre E w/ SF: elevated PC ratio, Mild range elevated BP. Labetalol protocol and consider magnesium if worsening symptoms   Celedonio Savage, MD  04/11/2023, 3:56 PM  Attestation of Attending Supervision of OB Fellow: Evaluation, management, and procedures were performed by the [facility] Fellow under my supervision and collaboration.  I have reviewed the OB Fellow's note and chart, and I agree with the management and plan.  Baldemar Lenis, MD, [facility] Attending Center for [employer] (Faculty Practice)  04/11/2023 5:39 PM

## 2023-04-11 NOTE — MAU Note (Signed)
Called Main Lab and requested blood work and urine to be ran as it was sent STAT at 1300 and 1314.

## 2023-04-11 NOTE — MAU Note (Signed)
...  Ashley Byrd is a 22 y.o. at 109w0d here in MAU reporting: She reports she has been occasionally leaking fluids since yesterday morning. She reports she will feel fluids run down her leg after getting up from sitting or lying a while. She reports in between the clear fluids she is noticing thick brown discharge that has a "vinegar type smell." Denies vaginal itching. Last IC last night. Denies VB. +FM.  Concerned about an increase in swelling in her legs and feet.  GBS+.  Onset of complaint: Yesterday morning Pain score: Denies pain.  FHT: 162 initial external Lab orders placed from triage: Crist Fat

## 2023-04-11 NOTE — Progress Notes (Signed)
Called main lab for second time and requested urine to be ran.

## 2023-04-12 ENCOUNTER — Inpatient Hospital Stay (HOSPITAL_COMMUNITY): Payer: Medicaid Other | Admitting: Anesthesiology

## 2023-04-12 ENCOUNTER — Encounter (HOSPITAL_COMMUNITY): Payer: Self-pay | Admitting: Obstetrics and Gynecology

## 2023-04-12 DIAGNOSIS — Z3A37 37 weeks gestation of pregnancy: Secondary | ICD-10-CM

## 2023-04-12 DIAGNOSIS — O1410 Severe pre-eclampsia, unspecified trimester: Secondary | ICD-10-CM

## 2023-04-12 DIAGNOSIS — O99820 Streptococcus B carrier state complicating pregnancy: Secondary | ICD-10-CM

## 2023-04-12 DIAGNOSIS — O1414 Severe pre-eclampsia complicating childbirth: Secondary | ICD-10-CM

## 2023-04-12 HISTORY — DX: Severe pre-eclampsia, unspecified trimester: O14.10

## 2023-04-12 LAB — COMPREHENSIVE METABOLIC PANEL
ALT: 14 U/L (ref 0–44)
AST: 21 U/L (ref 15–41)
Albumin: 2.6 g/dL — ABNORMAL LOW (ref 3.5–5.0)
Alkaline Phosphatase: 107 U/L (ref 38–126)
Anion gap: 11 (ref 5–15)
BUN: 13 mg/dL (ref 6–20)
CO2: 17 mmol/L — ABNORMAL LOW (ref 22–32)
Calcium: 8.4 mg/dL — ABNORMAL LOW (ref 8.9–10.3)
Chloride: 103 mmol/L (ref 98–111)
Creatinine, Ser: 0.66 mg/dL (ref 0.44–1.00)
GFR, Estimated: >60 mL/min (ref >60)
Glucose, Bld: 90 mg/dL (ref 70–99)
Potassium: 3.8 mmol/L (ref 3.5–5.1)
Sodium: 131 mmol/L — ABNORMAL LOW (ref 135–145)
Total Bilirubin: 0.7 mg/dL (ref 0.3–1.2)
Total Protein: 5.8 g/dL — ABNORMAL LOW (ref 6.5–8.1)

## 2023-04-12 LAB — CBC
HCT: 37.3 % (ref 36.0–46.0)
HCT: 35.5 % — ABNORMAL LOW (ref 36.0–46.0)
HCT: 30.5 % — ABNORMAL LOW (ref 36.0–46.0)
Hemoglobin: 12.6 g/dL (ref 12.0–15.0)
Hemoglobin: 11.8 g/dL — ABNORMAL LOW (ref 12.0–15.0)
Hemoglobin: 10.1 g/dL — ABNORMAL LOW (ref 12.0–15.0)
MCH: 29.8 pg (ref 26.0–34.0)
MCH: 29.5 pg (ref 26.0–34.0)
MCH: 29.6 pg (ref 26.0–34.0)
MCHC: 33.8 g/dL (ref 30.0–36.0)
MCHC: 33.2 g/dL (ref 30.0–36.0)
MCHC: 33.1 g/dL (ref 30.0–36.0)
MCV: 88.2 fL (ref 80.0–100.0)
MCV: 88.8 fL (ref 80.0–100.0)
MCV: 89.4 fL (ref 80.0–100.0)
Platelets: 198 10*3/uL (ref 150–400)
Platelets: 201 10*3/uL (ref 150–400)
Platelets: 179 10*3/uL (ref 150–400)
RBC: 4.23 MIL/uL (ref 3.87–5.11)
RBC: 4 MIL/uL (ref 3.87–5.11)
RBC: 3.41 MIL/uL — ABNORMAL LOW (ref 3.87–5.11)
RDW: 13.2 % (ref 11.5–15.5)
RDW: 13.2 % (ref 11.5–15.5)
RDW: 13.2 % (ref 11.5–15.5)
WBC: 19.1 10*3/uL — ABNORMAL HIGH (ref 4.0–10.5)
WBC: 22.3 10*3/uL — ABNORMAL HIGH (ref 4.0–10.5)
WBC: 19.2 10*3/uL — ABNORMAL HIGH (ref 4.0–10.5)
nRBC: 0 % (ref 0.0–0.2)
nRBC: 0 % (ref 0.0–0.2)
nRBC: 0 % (ref 0.0–0.2)

## 2023-04-12 LAB — MAGNESIUM: Magnesium: 6.3 mg/dL (ref 1.7–2.4)

## 2023-04-12 LAB — RPR: RPR Ser Ql: NONREACTIVE

## 2023-04-12 MED ORDER — LACTATED RINGERS IV SOLN: 500.0000 mL | Freq: Once | INTRAVENOUS | Status: DC

## 2023-04-12 MED ORDER — TRANEXAMIC ACID-NACL 1000-0.7 MG/100ML-% IV SOLN
INTRAVENOUS | Status: AC
  Administered 2023-04-12: 1000 mg
  Filled 2023-04-12: qty 100

## 2023-04-12 MED ORDER — MAGNESIUM SULFATE 40 GM/1000ML IV SOLN
1.0000 g/h | INTRAVENOUS | Status: AC
  Administered 2023-04-12: 1 g/h via INTRAVENOUS

## 2023-04-12 MED ORDER — FENTANYL-BUPIVACAINE-NACL 0.5-0.125-0.9 MG/250ML-% EP SOLN
12.0000 mL/h | EPIDURAL | Status: DC | PRN
  Administered 2023-04-12: 12 mL/h via EPIDURAL
  Filled 2023-04-12: qty 250

## 2023-04-12 MED ORDER — SENNOSIDES-DOCUSATE SODIUM 8.6-50 MG PO TABS
2.0000 | ORAL_TABLET | ORAL | Status: DC
  Administered 2023-04-13: 2 via ORAL
  Filled 2023-04-12: qty 2

## 2023-04-12 MED ORDER — WITCH HAZEL-GLYCERIN EX PADS: 1.0000 | MEDICATED_PAD | CUTANEOUS | Status: DC | PRN

## 2023-04-12 MED ORDER — FENTANYL CITRATE (PF) 100 MCG/2ML IJ SOLN
50.0000 ug | Freq: Once | INTRAMUSCULAR | Status: AC
  Administered 2023-04-12: 50 ug via INTRAVENOUS

## 2023-04-12 MED ORDER — LACTATED RINGERS IV SOLN: INTRAVENOUS | Status: DC

## 2023-04-12 MED ORDER — LIDOCAINE HCL (PF) 1 % IJ SOLN
INTRAMUSCULAR | Status: DC | PRN
  Administered 2023-04-12: 8 mL via EPIDURAL

## 2023-04-12 MED ORDER — DIPHENHYDRAMINE HCL 50 MG/ML IJ SOLN: 12.5000 mg | INTRAMUSCULAR | Status: DC | PRN

## 2023-04-12 MED ORDER — TERBUTALINE SULFATE 1 MG/ML IJ SOLN: 0.2500 mg | Freq: Once | INTRAMUSCULAR | Status: DC | PRN

## 2023-04-12 MED ORDER — TETANUS-DIPHTH-ACELL PERTUSSIS 5-2.5-18.5 LF-MCG/0.5 IM SUSY: 0.5000 mL | PREFILLED_SYRINGE | Freq: Once | INTRAMUSCULAR | Status: DC

## 2023-04-12 MED ORDER — CARBOPROST TROMETHAMINE 250 MCG/ML IM SOLN
INTRAMUSCULAR | Status: AC
  Filled 2023-04-12: qty 1

## 2023-04-12 MED ORDER — DIPHENHYDRAMINE HCL 25 MG PO CAPS
25.0000 mg | ORAL_CAPSULE | Freq: Four times a day (QID) | ORAL | Status: DC | PRN
  Administered 2023-04-13: 25 mg via ORAL
  Filled 2023-04-12: qty 1

## 2023-04-12 MED ORDER — PRENATAL MULTIVITAMIN CH
1.0000 | ORAL_TABLET | Freq: Every day | ORAL | Status: DC
  Administered 2023-04-13 – 2023-04-14 (×2): 1 via ORAL
  Filled 2023-04-12 (×2): qty 1

## 2023-04-12 MED ORDER — FENTANYL-BUPIVACAINE-NACL 0.5-0.125-0.9 MG/250ML-% EP SOLN: 12.0000 mL/h | EPIDURAL | Status: DC | PRN

## 2023-04-12 MED ORDER — EPHEDRINE 5 MG/ML INJ: 10.0000 mg | INTRAVENOUS | Status: DC | PRN

## 2023-04-12 MED ORDER — BENZOCAINE-MENTHOL 20-0.5 % EX AERO: 1.0000 | INHALATION_SPRAY | CUTANEOUS | Status: DC | PRN

## 2023-04-12 MED ORDER — OXYTOCIN-SODIUM CHLORIDE 30-0.9 UT/500ML-% IV SOLN
INTRAVENOUS | Status: AC
  Filled 2023-04-12: qty 500

## 2023-04-12 MED ORDER — MISOPROSTOL 25 MCG QUARTER TABLET
25.0000 ug | ORAL_TABLET | Freq: Once | ORAL | Status: AC
  Administered 2023-04-12: 25 ug via VAGINAL
  Filled 2023-04-12: qty 1

## 2023-04-12 MED ORDER — PHENYLEPHRINE 80 MCG/ML (10ML) SYRINGE FOR IV PUSH (FOR BLOOD PRESSURE SUPPORT): 80.0000 ug | PREFILLED_SYRINGE | INTRAVENOUS | Status: DC | PRN

## 2023-04-12 MED ORDER — ACETAMINOPHEN 325 MG PO TABS: 650.0000 mg | ORAL_TABLET | ORAL | Status: DC | PRN

## 2023-04-12 MED ORDER — OXYTOCIN-SODIUM CHLORIDE 30-0.9 UT/500ML-% IV SOLN
1.0000 m[IU]/min | INTRAVENOUS | Status: DC
  Administered 2023-04-12: 6 m[IU]/min via INTRAVENOUS
  Administered 2023-04-12: 2 m[IU]/min via INTRAVENOUS
  Administered 2023-04-12: 4 m[IU]/min via INTRAVENOUS
  Administered 2023-04-12: 8 m[IU]/min via INTRAVENOUS
  Filled 2023-04-12: qty 500

## 2023-04-12 MED ORDER — MISOPROSTOL 200 MCG PO TABS
ORAL_TABLET | ORAL | Status: AC
  Administered 2023-04-12: 1000 ug
  Filled 2023-04-12: qty 5

## 2023-04-12 MED ORDER — ONDANSETRON HCL 4 MG/2ML IJ SOLN: 4.0000 mg | INTRAMUSCULAR | Status: DC | PRN

## 2023-04-12 MED ORDER — SIMETHICONE 80 MG PO CHEW: 80.0000 mg | CHEWABLE_TABLET | ORAL | Status: DC | PRN

## 2023-04-12 MED ORDER — IBUPROFEN 600 MG PO TABS
600.0000 mg | ORAL_TABLET | Freq: Four times a day (QID) | ORAL | Status: DC
  Administered 2023-04-12 – 2023-04-14 (×8): 600 mg via ORAL
  Filled 2023-04-12 (×8): qty 1

## 2023-04-12 MED ORDER — FENTANYL CITRATE (PF) 100 MCG/2ML IJ SOLN
INTRAMUSCULAR | Status: AC
  Administered 2023-04-12: 50 ug
  Filled 2023-04-12: qty 4

## 2023-04-12 MED ORDER — COCONUT OIL OIL: 1.0000 | TOPICAL_OIL | Status: DC | PRN

## 2023-04-12 MED ORDER — ONDANSETRON HCL 4 MG PO TABS: 4.0000 mg | ORAL_TABLET | ORAL | Status: DC | PRN

## 2023-04-12 MED ORDER — DIBUCAINE (PERIANAL) 1 % EX OINT: 1.0000 | TOPICAL_OINTMENT | CUTANEOUS | Status: DC | PRN

## 2023-04-12 MED ORDER — ZOLPIDEM TARTRATE 5 MG PO TABS: 5.0000 mg | ORAL_TABLET | Freq: Every evening | ORAL | Status: DC | PRN

## 2023-04-12 NOTE — Lactation Note (Signed)
This note was copied from a baby's chart. Lactation Consultation Note  Patient Name: Ashley Byrd [id number] Date: 04/12/2023 Age:23 hours Reason for consult: L&D Initial assessment;Primapara;1st time breastfeeding;Early term 37-38.6wks;Breastfeeding assistance;RN request (LC L/D visit, P1 , baby wide awake and STS . LC offered to assist and mom receptive. LC expressed drops prior ro to latch on the Rt Br with depth and fed 10 mins with swallows. Baby released on her own , nipple well rounded.. Mom aware she will be later.)   Maternal Data Has patient been taught Hand Expression?: Yes Does the patient have breastfeeding experience prior to this delivery?: No  Feeding Mother's Current Feeding Choice: Breast Milk  LATCH Score Latch: Grasps breast easily, tongue down, lips flanged, rhythmical sucking.  Audible Swallowing: Spontaneous and intermittent  Type of Nipple: Everted at rest and after stimulation  Comfort (Breast/Nipple): Soft / non-tender  Hold (Positioning): Assistance needed to correctly position infant at breast and maintain latch.  LATCH Score: 9   Lactation Tools Discussed/Used    Interventions Interventions: Breast feeding basics reviewed;Assisted with latch;Skin to skin;Hand express;Reverse pressure;Breast compression;Adjust position;Support pillows;Education  Discharge    Consult Status Consult Status: Follow-up Date: 04/12/23 Follow-up type: In-patient    Matilde Sprang Shakenna Herrero 04/12/2023, 2:41 PM

## 2023-04-12 NOTE — Progress Notes (Signed)
W/ RNs assessment, fundus found to be displaced to R. Rn advised pt to try voiding again. Pt called out feeling faint and dizzy on toilet. Staff assisted pt back to bed. Vitals wnl. Blood clot the size of an orange in toilet. Fundas firm but still displaced to R side. Dr Berton Lan notified and en route.

## 2023-04-12 NOTE — Anesthesia Preprocedure Evaluation (Signed)
Anesthesia Evaluation  Patient identified by MRN, date of birth, ID band Patient awake    Reviewed: Allergy & Precautions, H&P , NPO status , Patient's Chart, lab work & pertinent test results, reviewed documented beta blocker date and time   Airway Mallampati: II  TM Distance: >3 FB Neck ROM: full    Dental no notable dental hx. (+) Teeth Intact, Dental Advisory Given   Pulmonary neg pulmonary ROS   Pulmonary exam normal breath sounds clear to auscultation       Cardiovascular negative cardio ROS Normal cardiovascular exam Rhythm:regular Rate:Normal     Neuro/Psych  PSYCHIATRIC DISORDERS  Depression    negative neurological ROS     GI/Hepatic Neg liver ROS,GERD  Medicated and Poorly Controlled,,  Endo/Other  negative endocrine ROS    Renal/GU negative Renal ROS  negative genitourinary   Musculoskeletal   Abdominal   Peds  Hematology negative hematology ROS (+)   Anesthesia Other Findings   Reproductive/Obstetrics (+) Pregnancy                             Anesthesia Physical Anesthesia Plan  ASA: 2  Anesthesia Plan: Epidural   Post-op Pain Management: Minimal or no pain anticipated   Induction: Intravenous  PONV Risk Score and Plan: 2 and Treatment may vary due to age or medical condition  Airway Management Planned: Natural Airway  Additional Equipment: None  Intra-op Plan:   Post-operative Plan:   Informed Consent: I have reviewed the patients History and Physical, chart, labs and discussed the procedure including the risks, benefits and alternatives for the proposed anesthesia with the patient or authorized representative who has indicated his/her understanding and acceptance.       Plan Discussed with: Anesthesiologist  Anesthesia Plan Comments:        Anesthesia Quick Evaluation

## 2023-04-12 NOTE — Progress Notes (Signed)
Labor Progress Note JUANNA FEILEN is a 22 y.o. G1P0000 at [redacted]w[redacted]d presented for IOL 2/2 severe preE.   S: About to get epidural.   O:  BP 122/83   Pulse 95   Temp 98.2 F (36.8 C) (Oral)   Resp 16   Ht 5' (1.524 m)   Wt 90.2 kg   LMP 08/05/2022   SpO2 99%   BMI 38.83 kg/m  EFM: 115bpm/moderate/+accels, no decels  CVE: Dilation: 8 Effacement (%): 100 Station: 0 Presentation: Vertex Exam by:: Concha Se, RN   A&P: 22 y.o. G1P0000 [redacted]w[redacted]d here for IOL 2/2 severe preE.  #Labor: On pitocin about to get epidural. Will AROM after comfortable  #Pain: planning epidural   #FWB: Cat I  #GBS positive  Severe preE  BP soft -Continue magnesium -Start q12 hour labs  Celedonio Savage, MD 2:08 PM

## 2023-04-12 NOTE — Progress Notes (Signed)
Handoff given to receiving nurse. 

## 2023-04-12 NOTE — Progress Notes (Signed)
Labor Progress Note Ashley Byrd is a 22 y.o. G1P0000 at [redacted]w[redacted]d presented for IOL 2/2 severe preE.   S: No acute concerns.   O:  BP (!) 114/58 (BP Location: Right Arm)   Pulse 89   Temp 98.1 F (36.7 C) (Oral)   Resp 16   Ht 5' (1.524 m)   Wt 90.2 kg   LMP 08/05/2022   SpO2 98%   BMI 38.83 kg/m  EFM: 115bpm/moderate/+accels, no decels  CVE: Dilation: 3 Effacement (%): 50 Station: -2 Presentation: Vertex Exam by:: Aura Dials RN   A&P: 22 y.o. G1P0000 [redacted]w[redacted]d here for IOL 2/2 severe preE.  #Labor: FB out. Vaginal 25 mcg cytotec given. Consider starting pitocin at next cervical exam.  #Pain: Maternally supported.  #FWB: Cat I  #GBS positive  Severe preE  BP soft -Continue magnesium -Start q12 hour labs  Aivan Fillingim Autry-Lott, DO 3:08 AM

## 2023-04-12 NOTE — Anesthesia Procedure Notes (Signed)
Epidural Patient location during procedure: OB Start time: 04/12/2023 9:17 AM End time: 04/12/2023 9:23 AM  Staffing Anesthesiologist: Heather Roberts, MD  Preanesthetic Checklist Completed: patient identified, IV checked, site marked, risks and benefits discussed, surgical consent, monitors and equipment checked, pre-op evaluation and timeout performed  Epidural Patient position: sitting Prep: DuraPrep and site prepped and draped Patient monitoring: continuous pulse ox and blood pressure Approach: midline Location: L3-L4 Injection technique: LOR air  Needle:  Needle type: Tuohy  Needle gauge: 17 G Needle length: 9 cm and 9 Needle insertion depth: 8 cm Catheter type: closed end flexible Catheter size: 19 Gauge Catheter at skin depth: 14 cm Test dose: negative  Assessment Events: blood not aspirated, no cerebrospinal fluid, injection not painful, no injection resistance, no paresthesia and negative IV test

## 2023-04-12 NOTE — Lactation Note (Signed)
This note was copied from a baby's chart. Lactation Consultation Note  Patient Name: Girl Esthefani Bastien ZOXWR'U Date: 04/12/2023 Age:23 hours Reason for consult: Follow-up assessment;Primapara;1st time breastfeeding;NICU baby;Early term 37-38.6wks;Infant < 6lbs;Other (Comment) (SGA)  Visited with family of 57 2/71 weeks old ETI female; Ms. Kover is a P1 and reports (++) breast changes during the pregnancy, FOB voices she's been leaking colostrum the last 6 weeks prior birth. Ms. Norling voiced baby has been latching on consistently and she has already started pumping, praised her for her efforts. Reviewed normal ETI behavior, feeding cues, lactogenesis II, pumping schedule and anticipatory guidelines.  Maternal Data Has patient been taught Hand Expression?: Yes Does the patient have breastfeeding experience prior to this delivery?: No  Feeding Mother's Current Feeding Choice: Breast Milk  LATCH Score Latch: Grasps breast easily, tongue down, lips flanged, rhythmical sucking.  Audible Swallowing: A few with stimulation  Type of Nipple: Everted at rest and after stimulation  Comfort (Breast/Nipple): Soft / non-tender  Hold (Positioning): Assistance needed to correctly position infant at breast and maintain latch.  LATCH Score: 8  Lactation Tools Discussed/Used Tools: Pump;Flanges Flange Size: 24 Breast pump type: Double-Electric Breast Pump Pump Education: Setup, frequency, and cleaning;Milk Storage Reason for Pumping: ETI, SGA Pumping frequency: after feedings/attempts at the breast Pumped volume:  (drops)  Interventions Interventions: Breast feeding basics reviewed;DEBP;Education;LC Services brochure  Discharge Pump: Personal ([employer] insurance pump)  Plan  Encouraged to continue taking baby to breast at least 8 times/24 hours or sooner if feeding cues are present She'll continue pumping after feedings/attempts at the breast and will supplement baby with  any amount of EBM she might get  FOB, MGM and mom's sisters present and supportive. All questions and concerns answered, family to contact [facility] services PRN.  Consult Status Consult Status: Follow-up Date: 04/13/23 Follow-up type: In-patient   Jabbar Palmero Venetia Constable 04/12/2023, 6:40 PM

## 2023-04-12 NOTE — Discharge Summary (Signed)
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 yo G1P0000 at [redacted]w[redacted]d was admitted to the hospital 04/11/2023 for induction of labor.  Indication for induction: Preeclampsia.  Patient had an uncomplicated labor course.  Membrane Rupture Time/Date: 8:50 AM ,04/12/2023   Delivery Method:Vaginal, Spontaneous  Episiotomy: None  Lacerations:  1st degree;Labial  Details of delivery can be found in separate delivery note.  Patient had a postpartum course complicated by***. Patient is discharged home 04/12/23.  Newborn Data: Birth date:04/12/2023  Birth time:1:22 PM  Gender:Female  Living status:Living  Apgars:7 ,9  Weight:   Magnesium Sulfate received: Yes: Seizure prophylaxis BMZ received: No Rhophylac:N/A MMR:N/A T-DaP:Given prenatally Flu: N/A Transfusion:{Transfusion received:30440034}  Physical exam  Vitals:   04/12/23 1331 04/12/23 1339 04/12/23 1346 04/12/23 1404  BP: 103/81 (!) 128/115 122/77 122/83  Pulse: (!) 104 99 98 95  Resp:      Temp:      TempSrc:      SpO2:      Weight:       Height:       General: {Exam; general:21111117} Lochia: {Desc; appropriate/inappropriate:30686::"appropriate"} Uterine Fundus: {Desc; firm/soft:30687} Incision: {Exam; incision:21111123} DVT Evaluation: {Exam; dvt:2111122} Labs: Lab Results  Component Value Date   WBC 19.1 (H) 04/12/2023   HGB 12.6 04/12/2023   HCT 37.3 04/12/2023   MCV 88.2 04/12/2023   PLT 198 04/12/2023      Latest Ref Rng & Units 04/12/2023    8:29 AM  CMP  Glucose 70 - 99 mg/dL 90   BUN 6 - 20 mg/dL 13   Creatinine 4.09 - 1.00 mg/dL 8.11   Sodium 914 - 782 mmol/L 131   Potassium 3.5 - 5.1 mmol/L 3.8   Chloride 98 - 111 mmol/L 103   CO2 22 - 32 mmol/L 17   Calcium 8.9 - 10.3 mg/dL 8.4   Total Protein 6.5 - 8.1 g/dL 5.8   Total Bilirubin 0.3 - 1.2 mg/dL 0.7   Alkaline Phos 38 - 126 U/L 107   AST 15 - 41 U/L 21   ALT 0 - 44 U/L 14    Edinburgh Score:     No data to display           After visit meds:  Allergies as of 04/12/2023   No Known Allergies   Med Rec must be completed prior to using this [facility]***        Discharge home in stable condition Infant  Feeding: {Baby feeding:23562} Infant Disposition:{CHL IP OB HOME WITH WUJWJX:91478} Discharge instruction: per After Visit Summary and Postpartum booklet. Activity: Advance as tolerated. Pelvic rest for 6 weeks.  Diet: {OB GNFA:21308657} Future Appointments: Future Appointments  Date Time Provider Department Center  04/16/2023 10:15 AM Calvert Cantor, CNM [facility]  04/23/2023 10:55 AM Anyanwu, Jethro Bastos, MD [facility]  04/30/2023 10:55 AM Calvert Cantor, CNM [facility]   Follow up Visit: Message sent   Please schedule this patient for a In person postpartum visit in 6 weeks with the following provider: Any provider. Additional Postpartum F/U:BP check 1 week  High risk pregnancy complicated by: HTN Delivery mode:  Vaginal, Spontaneous  Anticipated Birth Control:  PP Nexplanon  placed   04/12/2023 Celedonio Savage, MD

## 2023-04-12 NOTE — Significant Event (Addendum)
Called to see patient for bleeding. She is PPD0 from SVD w/ EBL at delivery.   On exam, fundus at level of umbilicus. Fundal push w/ large gush of blood. HDS w/ BP 140s/80s. Appropriate mentation, pt well appearing and conversant.  Discussed concern for ongoing bleeding and need for exam. Pt agreeable Fentanyl administered. LUS sweep attempted, but cervix only 2cm dilated. Unable to palpate clot behind cervix.  Pit 600cc/h started, misoprostol PR administered.  Pt continued to have bleeding. Additional fentanyl given. Fundal massage performed and large orange sized clot passed with several smaller clots. Additional attempt at LUS performed but cervix still 2cm dilated, was able to break up some clot within the cervix.  TXA administered. Deferred JADA placement due to inadequate cervical dilation. Would not give methergine with pt's HTN.   Fundus now firm at U-2. Bleeding has stopped.   Plan: - Will continue pit bolus with fundal checks q35min x 2, then q46min x 2. If stable, can dc fundal checks.   - If bleeding reoccurs or worsens, will obtain bedside US and consider D&C - CBC ordered  - Summary of interventions: bimanual massage, pit bolus at 600cc/h, miso , & TXA 1g x1  Harvie Bridge, MD Obstetrician & Gynecologist, [facility] for [employer], [facility]

## 2023-04-13 MED ORDER — OXYTOCIN-SODIUM CHLORIDE 30-0.9 UT/500ML-% IV SOLN
2.5000 [IU]/h | INTRAVENOUS | Status: AC
  Administered 2023-04-13: 2.5 [IU]/h via INTRAVENOUS
  Filled 2023-04-13: qty 500

## 2023-04-13 MED ORDER — CALCIUM CARBONATE ANTACID 500 MG PO CHEW
400.0000 mg | CHEWABLE_TABLET | Freq: Two times a day (BID) | ORAL | Status: DC | PRN
  Administered 2023-04-13 (×2): 400 mg via ORAL
  Filled 2023-04-13 (×2): qty 2

## 2023-04-13 MED ORDER — FUROSEMIDE 20 MG PO TABS
20.0000 mg | ORAL_TABLET | Freq: Every day | ORAL | Status: DC
  Administered 2023-04-13 – 2023-04-14 (×2): 20 mg via ORAL
  Filled 2023-04-13 (×2): qty 1

## 2023-04-13 MED ORDER — FERROUS SULFATE 325 (65 FE) MG PO TABS
325.0000 mg | ORAL_TABLET | ORAL | Status: DC
  Administered 2023-04-13: 325 mg via ORAL
  Filled 2023-04-13: qty 1

## 2023-04-13 MED ORDER — NIFEDIPINE ER OSMOTIC RELEASE 30 MG PO TB24
30.0000 mg | ORAL_TABLET | Freq: Every day | ORAL | Status: DC
  Administered 2023-04-13 – 2023-04-14 (×2): 30 mg via ORAL
  Filled 2023-04-13 (×2): qty 1

## 2023-04-13 NOTE — Progress Notes (Signed)
CSW received and acknowledges consult for EDPS of 9.  Consult screened out due to 9 on EDPS does not warrant a CSW consult.  MOB whom scores are greater than 9/yes to question 10 on Edinburgh Postpartum Depression Screen warrants a CSW consult.   MOB was also referred for history of depression and suicide attempt in 2020. * Referral screened out by Clinical Social Worker because none of the following criteria appear to apply: ~ History of depression during this pregnancy, or of post-partum depression following prior delivery. No concerns of depression noted in prenatal records.  ~ Diagnosis of depression within last 3 years. Per chart review MOB's depression dates back to 2020. Per chart review, MOB's suicide attempt was an isolated event in 2020 no current/recent concerns regarding suicide mentioned.  OR * MOB's symptoms currently being treated with medication and/or therapy.  Please contact the Clinical Social Worker if needs arise, by [facility] request, or if MOB scores greater than 9/yes to question 10 on Edinburgh Postpartum Depression Screen.   Celso Sickle, LCSW Clinical Social Worker [facility] Cell#: [phone]

## 2023-04-13 NOTE — Anesthesia Postprocedure Evaluation (Signed)
Anesthesia Post Note  Patient: [name]  Procedure(s) Performed: AN AD HOC LABOR EPIDURAL     Patient location during evaluation: Mother Baby Anesthesia Type: Epidural Level of consciousness: awake and alert Pain management: pain level controlled Vital Signs Assessment: post-procedure vital signs reviewed and stable Respiratory status: spontaneous breathing, nonlabored ventilation and respiratory function stable Cardiovascular status: stable Postop Assessment: no headache, no backache and epidural receding Anesthetic complications: no   No notable events documented.  Last Vitals:  Vitals:   04/13/23 1633 04/13/23 2022  BP: 125/71 134/69  Pulse: 87 92  Resp: 19 18  Temp: 37.2 C 36.9 C  SpO2: 99% 99%    Last Pain:  Vitals:   04/13/23 2022  TempSrc: Oral  PainSc:                  [name]

## 2023-04-13 NOTE — Lactation Note (Signed)
This note was copied from a baby's chart. Lactation Consultation Note  Patient Name: Ashley Byrd Date: 04/13/2023 Age:23 hours Reason for consult: Follow-up assessment;Early term 37-38.6wks;Infant < 6lbs;Other (Comment) (GHTN on MgSO4)  LC in to visit with P1 Mom of ET infant.  Baby's birthweight 5 lbs 15 oz, and baby is down 3%.  Baby being held swaddled against Mom's chest over gown.   Mom reports baby has been sleepy and not latching, so last feeding was 12 ml of formula 22 cal. Offered to help with position and latching.  Education on the benefits of baby being STS on Mom's chest.  Instantly, Mom removed her gown and placed baby STS prone on Mom's chest.  Talked about feeding cues to watch for. MD came in to examine baby.  Baby awake and showing cues. Baby positioned in football hold on right breast and LC assisted Mom to latch baby with a deep, wide latch to the breast.  Baby was consistently breastfeeding with deep jaw extensions and swallowing noted.  Mom taught to use alternate breast compression to increase milk transfer.   Reviewed importance of disassembling pump parts, washing, rinsing and air drying in separate bin provided.  Flanges were sitting by bathroom sink on counter.  Milk noted in bottles from pumping 2 hrs prior.  Syringe fed the colostrum while baby was on the breast.  Plan- 1- STS as much as possible watching for feeding cues 2- offer the breast with cues, making sure baby is latched deeply, ask for help prn 3- Pump after breast feeding if baby is sleepy at the breast or not waking after 3 hrs. 4- supplement 10-15 ml EBM +/ formula by paced bottle if baby unable to latch and feed.  Mom has a pump she received from her insurance.   Feeding Mother's Current Feeding Choice: Breast Milk and Formula  LATCH Score Latch: Grasps breast easily, tongue down, lips flanged, rhythmical sucking.  Audible Swallowing: Spontaneous and intermittent  Type of  Nipple: Everted at rest and after stimulation  Comfort (Breast/Nipple): Soft / non-tender  Hold (Positioning): Assistance needed to correctly position infant at breast and maintain latch.  LATCH Score: 9   Lactation Tools Discussed/Used Tools: Pump;Flanges;Bottle Flange Size: 21;24 (21 left breast, 24 right breast) Breast pump type: Double-Electric Breast Pump Pump Education: Setup, frequency, and cleaning;Milk Storage Reason for Pumping: support milk supply Pumping frequency: if baby is supplemented or sleepy at the breast Pumped volume: 2 mL  Interventions Interventions: Breast feeding basics reviewed;Assisted with latch;Skin to skin;Breast massage;Hand express;Breast compression;Adjust position;Support pillows;Position options;DEBP;Education     Consult Status Consult Status: Follow-up Date: 04/14/23 Follow-up type: In-patient    Judee Clara 04/13/2023, 11:50 AM

## 2023-04-13 NOTE — Progress Notes (Signed)
Post Partum Day 1 Subjective: Overnight had episode of bleeding & passing clots. See event note for details. This AM reports she is feeling much better. Reports bleeding has significantly improved. RN noted small trickle on fundal check this AM after having several normal checks overnight.   no complaints, voiding, and tolerating PO. OOB to bedside commode due to dizziness w/ ambulation last night. Has not tried to ambulate to bathroom yet.  Denies HA, visual changes, CP/SOB, RUQ/epigastric pain   Objective: Blood pressure 136/77, pulse 81, temperature 98.2 F (36.8 C), temperature source Oral, resp. rate 18, height 5' (1.524 m), weight 90.2 kg, last menstrual period 08/05/2022, SpO2 98 %, unknown if currently breastfeeding.  Physical Exam:  General: alert, cooperative, and no distress Lochia: appropriate Uterine Fundus: firm DVT Evaluation: No evidence of DVT seen on physical exam.  Recent Labs    04/12/23 1448 04/12/23 2322  HGB 11.8* 10.1*  HCT 35.5* 30.5*    Assessment/Plan: Postpartum - Contraception: to be discussed - MOF: breast  - Rh status: Rh+ - Rubella status: Immune - PO iron ordered for Hgb 10.1  Neonatal - Doing well at bedside  3. PreE w/ SF (visual changes) - continue mag x 24h PP - mild range Bps, will add procardia XL 30mg  daily - lasix x 5d PP - babyscripts, meds to beds on discharge  Dispo: continue routine postpartum care. Anticipate discharge home POD3-4   LOS: 2 days   Lennart Pall 04/13/2023, 7:16 AM

## 2023-04-14 ENCOUNTER — Other Ambulatory Visit (HOSPITAL_COMMUNITY): Payer: Self-pay

## 2023-04-14 MED ORDER — FERROUS SULFATE 325 (65 FE) MG PO TABS
325.0000 mg | ORAL_TABLET | ORAL | 1 refills | Status: AC
  Filled 2023-04-14: qty 100, 200d supply, fill #0

## 2023-04-14 MED ORDER — NIFEDIPINE ER 30 MG PO TB24
30.0000 mg | ORAL_TABLET | Freq: Every day | ORAL | 1 refills | Status: AC
  Filled 2023-04-14: qty 30, 30d supply, fill #0

## 2023-04-14 MED ORDER — FUROSEMIDE 20 MG PO TABS
20.0000 mg | ORAL_TABLET | Freq: Every day | ORAL | 0 refills | Status: AC
  Filled 2023-04-14: qty 3, 3d supply, fill #0

## 2023-04-14 MED ORDER — IBUPROFEN 600 MG PO TABS
600.0000 mg | ORAL_TABLET | Freq: Four times a day (QID) | ORAL | 0 refills | Status: AC
  Filled 2023-04-14: qty 30, 8d supply, fill #0

## 2023-04-14 MED ORDER — SENNOSIDES-DOCUSATE SODIUM 8.6-50 MG PO TABS
2.0000 | ORAL_TABLET | Freq: Every evening | ORAL | 0 refills | Status: AC | PRN
  Filled 2023-04-14: qty 30, 15d supply, fill #0

## 2023-04-14 MED ORDER — ACETAMINOPHEN 325 MG PO TABS
650.0000 mg | ORAL_TABLET | Freq: Four times a day (QID) | ORAL | 0 refills | Status: AC | PRN
  Filled 2023-04-14: qty 100, 13d supply, fill #0

## 2023-04-14 NOTE — Lactation Note (Signed)
This note was copied from a baby's chart. Lactation Consultation Note  Patient Name: Ashley Byrd ZOXWR'U Date: 04/14/2023 Age:23 hours Reason for consult: Early term 37-38.6wks;Infant < 6lbs;Infant weight loss;Primapara;1st time breastfeeding;Follow-up assessment;Mother's request (7 % weight loss,per mom baby recently fed. pe rmo has been feeding 10 mins on both breast and then supplementing , post pump.  LC reviewed the [facility] plan for ET, less than  6 pounds.) LC switched the plan - due to 7 % weight loss and baby being lees than 6 pounds.  LC recommended feeding the baby on the 1st breast 15 -20  mins, supplement 30 ml and then post pump both breast for 15 mins and save the milk to feed back to the baby . Next feeding switched to the other breast and do the same.  Follow this plan until Lc O/P appt.  LC reviewed BF D/C teaching and the [facility] resources.   Maternal Data    Feeding Mother's Current Feeding Choice: Breast Milk and Formula  LATCH Score - Latch score on the doc flow sheets-9     Lactation Tools Discussed/Used Tools: Pump Flange Size: 21;24 Breast pump type: Double-Electric Breast Pump Pump Education: Milk Storage Pumped volume: 10 mL  Interventions Interventions: Breast feeding basics reviewed;DEBP;Hand pump;Education;Pace feeding;LC Services brochure  Discharge Discharge Education: Engorgement and breast care;Warning signs for feeding baby;Outpatient recommendation;Outpatient Epic message sent;Other (comment) (mom aware she wil receive a call from [facility] O/P for F/U) Pump: Personal;DEBP  Consult Status Consult Status: Complete Date: 04/14/23    Kathrin Greathouse 04/14/2023, 1:31 PM

## 2023-04-14 NOTE — Progress Notes (Signed)
Patient given discharge instructions and denies any questions or concerns. Pt ambulatory off unit with family at this time .

## 2023-04-16 ENCOUNTER — Encounter: Payer: Medicaid Other | Admitting: Advanced Practice Midwife

## 2023-04-20 ENCOUNTER — Ambulatory Visit (INDEPENDENT_AMBULATORY_CARE_PROVIDER_SITE_OTHER): Payer: Medicaid Other | Admitting: *Deleted

## 2023-04-20 DIAGNOSIS — O1413 Severe pre-eclampsia, third trimester: Secondary | ICD-10-CM

## 2023-04-20 DIAGNOSIS — O1405 Mild to moderate pre-eclampsia, complicating the puerperium: Secondary | ICD-10-CM

## 2023-04-20 NOTE — Progress Notes (Signed)
Subjective:  Ashley Byrd is a 22 y.o. female here for BP check.   Hypertension ROS: Patient denies any headaches, visual symptoms, RUQ/epigastric pain or other concerning symptoms.  Objective:  BP 136/86   LMP 08/05/2022   Appearance alert, well appearing, and in no distress. General exam BP noted to be stable today in office.    Assessment:   Blood Pressure stable.   Plan:  Follow up as needed and or at postpartum visit .   Scheryl Marten, RN

## 2023-04-23 ENCOUNTER — Encounter: Payer: Medicaid Other | Admitting: Obstetrics & Gynecology

## 2023-04-30 ENCOUNTER — Encounter: Payer: Medicaid Other | Admitting: Advanced Practice Midwife

## 2023-05-07 ENCOUNTER — Encounter: Payer: Self-pay | Admitting: Advanced Practice Midwife

## 2023-05-20 ENCOUNTER — Encounter: Payer: Self-pay | Admitting: Family Medicine

## 2023-05-26 ENCOUNTER — Ambulatory Visit: Payer: Medicaid Other | Admitting: Obstetrics and Gynecology

## 2023-05-26 ENCOUNTER — Encounter: Payer: Self-pay | Admitting: Obstetrics and Gynecology

## 2023-05-26 VITALS — BP 130/83 | HR 91 | Wt 173.0 lb

## 2023-05-26 DIAGNOSIS — F53 Postpartum depression: Secondary | ICD-10-CM | POA: Diagnosis not present

## 2023-05-26 NOTE — Progress Notes (Signed)
    Post Partum Visit Note  Ashley Byrd is a 22 y.o. G1P1001 5/12 SVD/1st degree after 37wk IOL for severe pre-eclampsia with PPH, likely atony, after delivery; d/c Hgb 10.1    Anesthesia: epidural. Postpartum course has been uncomplicated. Baby is doing well. Baby is feeding by bottle - Similac . Bleeding no bleeding. Bowel function is normal. Bladder function is normal. Patient is sexually active. Contraception method is condoms. Postpartum depression screening: positive. EPDS = 21   Edinburgh Postnatal Depression Scale - 05/26/23 1023       Edinburgh Postnatal Depression Scale:  In the Past 7 Days   I have been able to laugh and see the funny side of things. 2    I have looked forward with enjoyment to things. 1    I have blamed myself unnecessarily when things went wrong. 3    I have been anxious or worried for no good reason. 2    I have felt scared or panicky for no good reason. 3    Things have been getting on top of me. 2    I have been so unhappy that I have had difficulty sleeping. 2    I have felt sad or miserable. 2    I have been so unhappy that I have been crying. 3    The thought of harming myself has occurred to me. 1    Edinburgh Postnatal Depression Scale Total 21            Review of Systems Pertinent items noted in HPI and remainder of comprehensive ROS otherwise negative.  Objective:  BP 130/83   Pulse 91   Wt 173 lb (78.5 kg)   LMP 08/05/2022   Breastfeeding Yes   BMI 33.79 kg/m    General: NAD Assessment:   Patient stable  Plan:  *PP: period has come back. If patient desires anything for birth control, patient to let [place] know -June 2023 pap neg *PP depression: patient lives with her in laws and FOB is in town on the weekends, due to work, and she says they are all living well together and she has support. She breastfeeds in the morning and night and does pumped milk and formula in between with the baby waking up about every 3 hours at  night.   I encouraged her to definitely don't feel guilty with asking family for help, get a nap in during the day, and for her to take some alone time like a movie, date night, relaxing at [employer], etc.  Patient with vague thought of harming self only (no plan) immediately PP when she was feeling overwhelmed especially with breastfeeding and contracts for safety and denies any thoughts of harm to others.   Will check TSH and RTC 2wks for mood check, virtual okay. Patient referred to [facility] at the main clinic for follow up and I told her about the [facility] urgent care across from the main clinic for any acute issues.   Volant Bing, MD Center for [employer], [facility]

## 2023-05-29 LAB — TSH+FREE T4
Free T4: 1.06 ng/dL (ref 0.82–1.77)
TSH: 2.07 u[IU]/mL (ref 0.450–4.500)

## 2023-06-09 ENCOUNTER — Ambulatory Visit: Payer: Medicaid Other | Admitting: Licensed Clinical Social Worker

## 2023-06-09 ENCOUNTER — Telehealth: Payer: Self-pay | Admitting: Licensed Clinical Social Worker

## 2023-06-09 DIAGNOSIS — Z91199 Patient's noncompliance with other medical treatment and regimen due to unspecified reason: Secondary | ICD-10-CM

## 2023-06-09 NOTE — BH Specialist Note (Signed)
Pt not show visit

## 2023-06-09 NOTE — Telephone Encounter (Signed)
Called pt regarding scheduled mychart visit. Left message requesting callback  

## 2023-06-18 ENCOUNTER — Telehealth: Payer: Medicaid Other | Admitting: Obstetrics and Gynecology

## 2024-02-29 ENCOUNTER — Other Ambulatory Visit (HOSPITAL_COMMUNITY): Payer: Self-pay
